# Patient Record
Sex: Female | Born: 1965 | Race: White | Hispanic: No | Marital: Married | Smoking: Never smoker
Health system: Southern US, Community
[De-identification: ages and names within clinical notes are randomized; demographics above are authoritative.]

## PROBLEM LIST (undated history)

## (undated) DIAGNOSIS — A048 Other specified bacterial intestinal infections: Secondary | ICD-10-CM

## (undated) DIAGNOSIS — K754 Autoimmune hepatitis: Secondary | ICD-10-CM

## (undated) HISTORY — PX: CYSTECTOMY: SUR359

## (undated) HISTORY — DX: Other disorders of bilirubin metabolism: E80.6

## (undated) HISTORY — DX: Autoimmune hepatitis: K75.4

## (undated) HISTORY — DX: Other specified bacterial intestinal infections: A04.8

---

## 2011-05-30 ENCOUNTER — Inpatient Hospital Stay (HOSPITAL_COMMUNITY)
Admission: RE | Admit: 2011-05-30 | Discharge: 2011-06-01 | DRG: 948 | Disposition: A | Discharge status: Home / Self Care | Payer: 59 | Source: Other Acute Inpatient Hospital | Attending: Internal Medicine | Admitting: Internal Medicine

## 2011-05-30 ENCOUNTER — Inpatient Hospital Stay (HOSPITAL_COMMUNITY): Payer: 59

## 2011-05-30 DIAGNOSIS — R7989 Other specified abnormal findings of blood chemistry: Principal | ICD-10-CM | POA: Diagnosis present

## 2011-05-30 DIAGNOSIS — R1013 Epigastric pain: Secondary | ICD-10-CM | POA: Diagnosis present

## 2011-05-30 DIAGNOSIS — R112 Nausea with vomiting, unspecified: Secondary | ICD-10-CM | POA: Diagnosis present

## 2011-05-31 ENCOUNTER — Encounter (HOSPITAL_COMMUNITY): Payer: Self-pay | Admitting: Radiology

## 2011-05-31 DIAGNOSIS — R1013 Epigastric pain: Secondary | ICD-10-CM

## 2011-05-31 DIAGNOSIS — R740 Nonspecific elevation of levels of transaminase and lactic acid dehydrogenase [LDH]: Secondary | ICD-10-CM

## 2011-05-31 DIAGNOSIS — R112 Nausea with vomiting, unspecified: Secondary | ICD-10-CM

## 2011-05-31 LAB — COMPREHENSIVE METABOLIC PANEL
ALT: 469 U/L — ABNORMAL HIGH (ref 0–35)
AST: 234 U/L — ABNORMAL HIGH (ref 0–37)
Albumin: 3.2 g/dL — ABNORMAL LOW (ref 3.5–5.2)
Alkaline Phosphatase: 119 U/L — ABNORMAL HIGH (ref 39–117)
Calcium: 8.9 mg/dL (ref 8.4–10.5)
Glucose, Bld: 103 mg/dL — ABNORMAL HIGH (ref 70–99)
Potassium: 3.5 mEq/L (ref 3.5–5.1)
Sodium: 137 mEq/L (ref 135–145)
Total Protein: 6.7 g/dL (ref 6.0–8.3)

## 2011-05-31 LAB — CBC
Hemoglobin: 13.4 g/dL (ref 12.0–15.0)
MCH: 31.6 pg (ref 26.0–34.0)
MCHC: 35.2 g/dL (ref 30.0–36.0)
Platelets: 264 10*3/uL (ref 150–400)
RDW: 12.9 % (ref 11.5–15.5)

## 2011-05-31 MED ORDER — IOHEXOL 300 MG/ML  SOLN
100.0000 mL | Freq: Once | INTRAMUSCULAR | Status: AC | PRN
  Administered 2011-05-30: 100 mL via INTRAVENOUS

## 2011-06-01 DIAGNOSIS — R7402 Elevation of levels of lactic acid dehydrogenase (LDH): Secondary | ICD-10-CM

## 2011-06-01 DIAGNOSIS — R1013 Epigastric pain: Secondary | ICD-10-CM

## 2011-06-01 DIAGNOSIS — R740 Nonspecific elevation of levels of transaminase and lactic acid dehydrogenase [LDH]: Secondary | ICD-10-CM

## 2011-06-01 LAB — C-REACTIVE PROTEIN: CRP: 0.4 mg/dL — ABNORMAL LOW (ref <0.6)

## 2011-06-01 LAB — HEPATIC FUNCTION PANEL
ALT: 444 U/L — ABNORMAL HIGH (ref 0–35)
Bilirubin, Direct: 1.1 mg/dL — ABNORMAL HIGH (ref 0.0–0.3)
Indirect Bilirubin: 1 mg/dL — ABNORMAL HIGH (ref 0.3–0.9)
Total Protein: 6.2 g/dL (ref 6.0–8.3)

## 2011-06-01 LAB — LIPASE, BLOOD: Lipase: 166 U/L — ABNORMAL HIGH (ref 11–59)

## 2011-06-03 ENCOUNTER — Telehealth: Payer: Self-pay | Admitting: Gastroenterology

## 2011-06-03 LAB — HEPATITIS PANEL, ACUTE
HCV Ab: NEGATIVE
Hep A IgM: NEGATIVE
Hepatitis B Surface Ag: NEGATIVE

## 2011-06-03 LAB — ANTI-SMOOTH MUSCLE ANTIBODY, IGG: F-Actin IgG: 15 U (ref <20)

## 2011-06-03 NOTE — Telephone Encounter (Signed)
Discussed with Mike Gip PA patient can return to work on 06/10/11 if she is feeling better.  She does need LFT's the end of this week.

## 2011-06-03 NOTE — Telephone Encounter (Signed)
Per discharge summary patient is to follow up with Mike Gip PA or Dr Marina Goodell in 10-14 days.  She is scheduled to see Dr Marina Goodell 06/19/11.  Patient wants to know if she is able to return to work on 06/10/11.  Amy Esterwood PA please advise.

## 2011-06-04 NOTE — Consult Note (Signed)
Taylor Howell                 ACCOUNT NO.:  [id number]  MEDICAL RECORD NO.:  [id number]  LOCATION:  5524                         FACILITY:  [facility]  PHYSICIAN:  Angelia Mould. Derrell Lolling, M.D.DATE OF BIRTH:  [birth year]  DATE OF CONSULTATION:  05/30/2011 DATE OF DISCHARGE:                                CONSULTATION   REQUESTING PHYSICIAN:  Peggye Pitt, MD  CONSULTING SURGEON:  Angelia Mould. Derrell Lolling, MD  REASON FOR CONSULTATION:  Hyperbilirubinemia.  HISTORY OF PRESENT ILLNESS:  Taylor Howell is a very pleasant 45 year old healthy white female who developed epigastric abdominal pain last Thursday.  She also developed some nausea and due to continued pain, she went to the Urgent Care Clinic in [place], [place].  At that time, she had a blood test drawn which was positive for H. pylori.  The patient was put on Prevpac and antibiotics for this condition.  Over the next several days, her pain improved some, however, this past Monday her pain worsened.  She developed significant nausea and vomiting as well as itching.  She went to [facility] where she was found to have an elevated total bilirubin as well as a mild elevation in her pancreatic enzymes with a lipase of 117.  She had further workup which included an ultrasound which was negative for gallstones, gallbladder wall thickening, or ductal dilatation but was admitted otherwise for "biliary pancreatitis".  Over the next several days, her transaminases began to slowly trend down, however, her bilirubin remained stable around 3.5.  At this time, it was felt by the physicians at [facility] that the patient needed an MRCP, however, they are unable to obtain this secondary to the patient's claustrophobia.  She was then requested to be transferred down here for further GI workup including a possible ERCP or surgical intervention.  Upon arrival, we have been asked to evaluate the patient.  She currently denies any abdominal  pain, nausea, or vomiting.  REVIEW OF SYSTEMS:  Please see HPI.  Otherwise all other systems have been reviewed and are negative.  FAMILY HISTORY:  Noncontributory.  PAST MEDICAL HISTORY:  There is none.  PAST SURGICAL HISTORY:  Removal of cyst from her head.  SOCIAL HISTORY:  The patient is married with 1 child.  She denies any alcohol, tobacco, or illicit drug abuse.  ALLERGIES:  NKDA.  MEDICATIONS:  Lansoprazole, amoxicillin, clarithromycin, and prochlorperazine.  PHYSICAL EXAMINATION:  GENERAL:  Taylor Howell is a very pleasant obese 45- year-old white female who is currently lying in bed in no acute distress. VITAL SIGNS:  Temperature 98.1, pulse 81, respirations 20, blood pressure 123/88. HEENT: Head is normocephalic, atraumatic.  Sclerae are slightly icteric. Otherwise pupils are equal, round, and reactive to light.  Ears and nose without any obvious masses or lesions.  No rhinorrhea.  Mouth is pink. Throat shows no exudate. HEART:  Regular rate and rhythm.  Normal S1, S2.  No murmurs, gallops, or rubs are noted.  She does have palpable carotid radial and pedal pulses bilaterally. LUNGS:  Clear to auscultation bilaterally with no wheezes, rhonchi, or rales noted.  Respiratory effort is nonlabored. ABDOMEN:  Soft, nontender, nondistended with active bowel sounds.  She  is otherwise obese but no masses, hernias, or organomegaly are noted. MUSCULOSKELETAL:  All 4 extremities are symmetrical.  No cyanosis, clubbing, or edema. NEURO:  Cranial nerves II through XII appear to be grossly intact.  Deep tendon reflex exam deferred at this time. PSYCH:  The patient is alert and oriented x3 with an appropriate affect.  LABS AND DIAGNOSTICS:  All pending at this time.  However, her most recent bilirubin was found to be 3.5 today at [facility].  She did have a ultrasound which revealed no gallstones, pericholecystic fluid, or gallbladder wall thickening or ductal  dilatation.  IMPRESSION: 1. Hyperbilirubinemia with transaminitis. 2. Mild pancreatitis of unknown etiology. 3. H. pylori.  PLAN:  The patient does not have any evidence of gallstones or any other findings on ultrasound that indicate a biliary source of her minimal pancreatitis.  There is also no obvious cause for her hyperbilirubinemia.  At this time we feel the patient needs further imaging and workup.  I have discussed this with Dr. Ardyth Harps of the hospitalist service and we agree with a CT scan and possible ERCP or EUS is needed following the CT scan.  At this time, there was no definite need for a laparoscopic cholecystectomy, However, we will follow along with you in case the need arises.  Thank you for this consultation.     Taylor Cape, PA   ______________________________ Angelia Mould. Derrell Lolling, M.D.    KEO/MEDQ  D:  05/31/2011  T:  05/31/2011  Job:  [id number]  cc:   Peggye Pitt, M.D.  Electronically Signed by Barnetta Chapel PA on 06/03/2011 04:19:36 PM Electronically Signed by Claud Kelp M.D. on 06/04/2011 09:03:23 AM

## 2011-06-05 ENCOUNTER — Encounter: Payer: Self-pay | Admitting: *Deleted

## 2011-06-05 ENCOUNTER — Telehealth: Payer: Self-pay | Admitting: Gastroenterology

## 2011-06-05 NOTE — Telephone Encounter (Signed)
Per pt request, faxed a return to work note for her signed by Dr Russella Dar- fax [phone]

## 2011-06-06 ENCOUNTER — Other Ambulatory Visit (INDEPENDENT_AMBULATORY_CARE_PROVIDER_SITE_OTHER): Payer: 59

## 2011-06-06 LAB — HEPATIC FUNCTION PANEL
ALT: 932 U/L — ABNORMAL HIGH (ref 0–35)
Albumin: 4.1 g/dL (ref 3.5–5.2)
Alkaline Phosphatase: 129 U/L — ABNORMAL HIGH (ref 39–117)
Bilirubin, Direct: 0.8 mg/dL — ABNORMAL HIGH (ref 0.0–0.3)
Total Protein: 8 g/dL (ref 6.0–8.3)

## 2011-06-10 ENCOUNTER — Telehealth: Payer: Self-pay

## 2011-06-10 NOTE — Telephone Encounter (Signed)
Message copied by Michele Mcalpine on Mon Jun 10, 2011 12:51 PM ------      Message from: Taylor Howell      Created: Mon Jun 10, 2011 12:06 PM       Liver tests are no better, actually worse. Please pull ALL lab results from her recent hospital stay and put them on my desk today. Get her to come in to see me tomorrow morning around 10 am

## 2011-06-10 NOTE — Telephone Encounter (Signed)
Left message for pt to call back asap.

## 2011-06-11 ENCOUNTER — Other Ambulatory Visit: Payer: Self-pay | Admitting: Internal Medicine

## 2011-06-11 ENCOUNTER — Other Ambulatory Visit (INDEPENDENT_AMBULATORY_CARE_PROVIDER_SITE_OTHER): Payer: 59

## 2011-06-11 ENCOUNTER — Other Ambulatory Visit: Payer: Self-pay

## 2011-06-11 ENCOUNTER — Encounter: Payer: Self-pay | Admitting: Internal Medicine

## 2011-06-11 ENCOUNTER — Telehealth: Payer: Self-pay

## 2011-06-11 ENCOUNTER — Ambulatory Visit (INDEPENDENT_AMBULATORY_CARE_PROVIDER_SITE_OTHER): Payer: 59 | Admitting: Internal Medicine

## 2011-06-11 VITALS — BP 112/68 | HR 80 | Ht 66.0 in | Wt 218.0 lb

## 2011-06-11 DIAGNOSIS — R748 Abnormal levels of other serum enzymes: Secondary | ICD-10-CM

## 2011-06-11 DIAGNOSIS — R933 Abnormal findings on diagnostic imaging of other parts of digestive tract: Secondary | ICD-10-CM

## 2011-06-11 LAB — CBC WITH DIFFERENTIAL/PLATELET
Basophils Relative: 0.3 % (ref 0.0–3.0)
Eosinophils Relative: 1.4 % (ref 0.0–5.0)
HCT: 41.2 % (ref 36.0–46.0)
Hemoglobin: 14.2 g/dL (ref 12.0–15.0)
Lymphs Abs: 1.3 10*3/uL (ref 0.7–4.0)
MCHC: 34.4 g/dL (ref 30.0–36.0)
MCV: 92.9 fl (ref 78.0–100.0)
Monocytes Absolute: 1 10*3/uL (ref 0.1–1.0)
Neutro Abs: 4.2 10*3/uL (ref 1.4–7.7)
RBC: 4.43 Mil/uL (ref 3.87–5.11)
WBC: 6.6 10*3/uL (ref 4.5–10.5)

## 2011-06-11 LAB — HEPATIC FUNCTION PANEL
Bilirubin, Direct: 3.5 mg/dL — ABNORMAL HIGH (ref 0.0–0.3)
Total Bilirubin: 5.7 mg/dL — ABNORMAL HIGH (ref 0.3–1.2)

## 2011-06-11 LAB — BASIC METABOLIC PANEL
BUN: 8 mg/dL (ref 6–23)
Creatinine, Ser: 0.6 mg/dL (ref 0.4–1.2)
GFR: 123.87 mL/min (ref >60.00)
Potassium: 3.8 mEq/L (ref 3.5–5.1)

## 2011-06-11 LAB — FERRITIN: Ferritin: 1190.8 ng/mL — ABNORMAL HIGH (ref 10.0–291.0)

## 2011-06-11 NOTE — Telephone Encounter (Signed)
Spoke with pt and she is aware. Orders entered for biopsy. Waiting to hear back from IR for appt.

## 2011-06-11 NOTE — Telephone Encounter (Signed)
Message copied by Michele Mcalpine on Tue Jun 11, 2011  4:20 PM ------      Message from: Hilarie Fredrickson      Created: Tue Jun 11, 2011  4:11 PM       Please let the patient know that her liver tests are no better, actually worse. Fortunately, her liver performance study such as albumin and prothrombin time remained normal (this is good). We discussed the possible value of liver biopsy. I would like to proceed with an ultrasound guided CORE BIOPSY of the liver this week (performed by interventional radiology). The reason "significantly elevated hepatic transaminases of uncertain cause".

## 2011-06-11 NOTE — Patient Instructions (Signed)
Go to basement floor and have your labs done today. Keep appointment with Dr. Marina Goodell for 06/19/11.

## 2011-06-11 NOTE — Telephone Encounter (Signed)
Pt aware of results per Dr. Marina Goodell, she will be here for her 10am appt.

## 2011-06-11 NOTE — Progress Notes (Signed)
HISTORY OF PRESENT ILLNESS:  Taylor Howell is a 45 y.o. female with no significant past medical history who presents today, post hospitalization, regarding problems with abdominal pain and abnormal liver tests. She was seen in consultation on 05/30/2011 when she was transferred to this hospital from an outside facility with the presumptive diagnosis of biliary pancreatitis. Her chief complaints were that of malaise and abdominal pain followed by problems with nausea, vomiting, and pruritus after being treated with triple therapy for the positive H. pylori antibody. She did have a minimally elevated lipase. Liver tests were significantly abnormal with SGOT 200 range SGPT in the 400 range. Alkaline phosphatase 119 total bilirubin 3.0. Total protein 6.7 and albumin 3.2. Imaging studies were fairly unremarkable with normal liver, delivery system, and pancreas. Some nonspecific nodes in the porta hepatis and hepatic duodenal region were noted. Clinically, she did well with no evidence of fever, nausea, or vomiting. Her abdominal pain resolved and she was able to eat without difficulty. Other laboratories were normal including CBC, prothrombin time, basic chemistries, acute hepatitis serologies, antinuclear antibody, anti-smooth muscle antibody, antimitochondrial antibody, ceruloplasmin, and C-reactive protein. She was discharged home on July 14. Complaints since that time have included nonspecific fatigue, significant reflux with pyrosis, and mild nausea. She is taking no medications regularly. She does have occasional intermittent tenderness on her right side. Good bowel habits. Routine repeat liver studies were ordered and performed on July 19. This revealed worsening transaminases with SGPT 932, SGOT 468, alkaline phosphatase 129, and total bilirubin 1.8. Normal protein of 8.0 with normal albumin 4.1. She was instructed to come into the office at this time, premature of her scheduled office appointment next week.  She is accompanied by her son  REVIEW OF SYSTEMS:  All non-GI ROS negative except for fatigue and itching.  Past Medical History  Diagnosis Date  . Helicobacter pylori (H. pylori)   . Hyperbilirubinemia     Past Surgical History  Procedure Date  . Cystectomy     Head    Social History Taylor Howell  reports that she has never smoked. She has never used smokeless tobacco. She reports that she does not drink alcohol or use illicit drugs.  family history includes Diabetes in her maternal grandfather.  There is no history of Colon cancer.  No Known Allergies     PHYSICAL EXAMINATION: Vital signs: BP 112/68  Pulse 80  Ht 5\' 6"  (1.676 m)  Wt 218 lb (98.884 kg)  BMI 35.19 kg/m2  LMP 06/03/2011  Constitutional: generally well-appearing, no acute distress Psychiatric: alert and oriented x3, cooperative. Anxious Eyes: extraocular movements intact, anicteric, conjunctiva pink Mouth: oral pharynx moist, no lesions Neck: supple no lymphadenopathy Cardiovascular: heart regular rate and rhythm, no murmur Lungs: clear to auscultation bilaterally Abdomen: soft, nontender, nondistended, no obvious ascites, no peritoneal signs, normal bowel sounds, no organomegaly Extremities: no lower extremity edema bilaterally Skin: no lesions on visible extremities Neuro: No focal deficits. No asterixis.   ASSESSMENT:  #1. Marked hepatic transaminitis after problems with acute illness as described. Negative workup to date as outlined. Possible causes include atypical infectious hepatitis, drug reaction (amoxicillin and Biaxin exposure), or ANA negative autoimmune hepatitis. Clinically stable without evidence of hepatic synthetic dysfunction. #2. Portal adenopathy on CT of uncertain clinical significance.   PLAN:  #1. Check anti-liver-kidney-microsomal antibody, iron studies, and repeat transaminases, CBC, TSH, and prothrombin time. #2. Keep routine followup appointment next week. Contact the  office in the interim for questions or problems #3.  Will likely need liver biopsy if liver test abnormalities persist without cause identified. I discussed this with her and her son in detail.

## 2011-06-12 ENCOUNTER — Telehealth: Payer: Self-pay | Admitting: Internal Medicine

## 2011-06-12 NOTE — Telephone Encounter (Signed)
She has been having discomfort intermittently. Unless this becomes severe, I would proceed with the plans for liver biopsy tomorrow.

## 2011-06-12 NOTE — Telephone Encounter (Signed)
Pt states that yesterday evening she started having some discomfort on both sides below her shoulder blades on her back. Pt reports that she is still having it, has had it all day and it is a constant discomfort, rates the pain at a 5. Pt wants to know if this is something she should be concerned about. Please advise.

## 2011-06-12 NOTE — Telephone Encounter (Signed)
U/S guided core biopsy of liver scheduled for 06/13/11@[facility]  arrival time 8:30am, appt time 10am. Taylor Howell in radiology notified pt of appt date and time.

## 2011-06-12 NOTE — Telephone Encounter (Signed)
Pt aware.

## 2011-06-13 ENCOUNTER — Other Ambulatory Visit: Payer: Self-pay | Admitting: Interventional Radiology

## 2011-06-13 ENCOUNTER — Ambulatory Visit (HOSPITAL_COMMUNITY)
Admission: RE | Admit: 2011-06-13 | Discharge: 2011-06-13 | Disposition: A | Discharge status: Home / Self Care | Payer: 59 | Source: Ambulatory Visit | Attending: Internal Medicine | Admitting: Internal Medicine

## 2011-06-13 DIAGNOSIS — R7401 Elevation of levels of liver transaminase levels: Secondary | ICD-10-CM | POA: Insufficient documentation

## 2011-06-13 DIAGNOSIS — Z01812 Encounter for preprocedural laboratory examination: Secondary | ICD-10-CM | POA: Insufficient documentation

## 2011-06-13 DIAGNOSIS — R7402 Elevation of levels of lactic acid dehydrogenase (LDH): Secondary | ICD-10-CM | POA: Insufficient documentation

## 2011-06-13 LAB — CBC
HCT: 38.8 % (ref 36.0–46.0)
Hemoglobin: 13.7 g/dL (ref 12.0–15.0)
MCH: 31.7 pg (ref 26.0–34.0)
MCV: 89.8 fL (ref 78.0–100.0)
RBC: 4.32 MIL/uL (ref 3.87–5.11)

## 2011-06-13 LAB — PROTIME-INR: Prothrombin Time: 13.2 seconds (ref 11.6–15.2)

## 2011-06-14 ENCOUNTER — Telehealth: Payer: Self-pay | Admitting: Internal Medicine

## 2011-06-14 ENCOUNTER — Telehealth: Payer: Self-pay

## 2011-06-14 LAB — ANTI-MICROSOMAL ANTIBODY LIVER / KIDNEY: LKM1 Ab: <=20 U

## 2011-06-14 MED ORDER — PREDNISONE 20 MG PO TABS: ORAL_TABLET | ORAL | Status: DC

## 2011-06-14 NOTE — Telephone Encounter (Signed)
rx sent to pharmacy

## 2011-06-14 NOTE — Telephone Encounter (Signed)
Taylor Howell, I spoke with the pathologist, Dr. Frederica Kuster, who feels changes a most c/w autoimmune hepatitis or drug. With rising LFTs , I want her to start prednisone 40 mg daily and keep her OV next week.

## 2011-06-14 NOTE — Telephone Encounter (Signed)
Called pts home phone and left message for her to call back. Called pts work number and pt was not there. Called cell number and left message for her to call back. Prescription for Prednisone sent to the pharmacy.  Spoke with pt and she is aware of the results per Dr. Marina Goodell. Rx sent to the pharmacy, pt instructed on Prednisone side effects. Pt knows to keep her OV next week. Pt verbalized understanding.

## 2011-06-19 ENCOUNTER — Encounter: Payer: Self-pay | Admitting: Internal Medicine

## 2011-06-19 ENCOUNTER — Other Ambulatory Visit (INDEPENDENT_AMBULATORY_CARE_PROVIDER_SITE_OTHER): Payer: 59

## 2011-06-19 ENCOUNTER — Ambulatory Visit (INDEPENDENT_AMBULATORY_CARE_PROVIDER_SITE_OTHER): Payer: 59 | Admitting: Internal Medicine

## 2011-06-19 VITALS — BP 118/74 | HR 80 | Ht 66.0 in | Wt 219.0 lb

## 2011-06-19 DIAGNOSIS — K219 Gastro-esophageal reflux disease without esophagitis: Secondary | ICD-10-CM

## 2011-06-19 DIAGNOSIS — K754 Autoimmune hepatitis: Secondary | ICD-10-CM

## 2011-06-19 LAB — PROTIME-INR: INR: 1 ratio (ref 0.8–1.0)

## 2011-06-19 NOTE — Progress Notes (Signed)
HISTORY OF PRESENT ILLNESS:  Taylor Howell is a 45 y.o. female who has been evaluated recently for abnormal liver tests and GERD. At the time of her last visit, her hepatic transaminases and bilirubin worsened with SGOT greater than 400 and SGPT greater than 1100. Total bilirubin 5.7. Prothrombin time normal. All viral and non-viral studies, including autoimmune studies were negative or normal. We elected to proceed with liver biopsy. I discussed this result with the pathologist, Dr. Frederica Kuster who reported changes most consistent with autoimmune hepatitis versus drug. She was initiated on prednisone 40 mg daily, which she began 4 days ago. She is accompanied today by her husband, who tape records the encounter. The patient states that she feels a little better. Still with some right scapular pain. Improved appetite without vomiting. Reflux symptoms have improved on PPI. Tolerating prednisone with minimal side effects.  REVIEW OF SYSTEMS:  All non-GI ROS negative except for pruritus  Past Medical History  Diagnosis Date  . Helicobacter pylori (H. pylori)   . Hyperbilirubinemia   . Autoimmune hepatitis     Past Surgical History  Procedure Date  . Cystectomy     Head    Social History Taylor Howell  reports that she has never smoked. She has never used smokeless tobacco. She reports that she does not drink alcohol or use illicit drugs.  family history includes Diabetes in her maternal grandfather.  There is no history of Colon cancer.  No Known Allergies     PHYSICAL EXAMINATION: Vital signs: BP 118/74  Pulse 80  Ht 5\' 6"  (1.676 m)  Wt 219 lb (99.338 kg)  BMI 35.35 kg/m2  LMP 06/03/2011 General: Well-developed, well-nourished, no acute distress HEENT: Sclerae are anicteric, conjunctiva pink. Oral mucosa intact Lungs: Clear Heart: Regular Abdomen: soft, obese, nontender, nondistended, no obvious ascites, no peritoneal signs, normal bowel sounds. No organomegaly. Extremities: No  edema Psychiatric: alert and oriented x3. Cooperative    ASSESSMENT:  #1. 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 Now on prednisone. #2. GERD. Improved on PPI   PLAN:  #1. Detailed discussion today on autoimmune hepatitis. Multiple questions answered to their satisfaction. #2. Continue prednisone 40 mg daily #3. Repeat LFTs and PT/INR today and once weekly thereafter until further notice #4. Office followup in a few weeks #5. Reflux precautions and PPI to be continued

## 2011-06-19 NOTE — Patient Instructions (Signed)
Go to basement floor today and have labs drawn. You will have them weekly  Follow-up with Dr. Marina Goodell in 3 weeks.

## 2011-06-20 LAB — HEPATIC FUNCTION PANEL
ALT: 552 U/L — ABNORMAL HIGH (ref 0–35)
AST: 275 U/L — ABNORMAL HIGH (ref 0–37)
Alkaline Phosphatase: 148 U/L — ABNORMAL HIGH (ref 39–117)
Bilirubin, Direct: 2.4 mg/dL — ABNORMAL HIGH (ref 0.0–0.3)
Total Bilirubin: 5 mg/dL — ABNORMAL HIGH (ref 0.3–1.2)

## 2011-06-21 ENCOUNTER — Telehealth: Payer: Self-pay

## 2011-06-21 NOTE — Telephone Encounter (Signed)
Pt aware.

## 2011-06-21 NOTE — Telephone Encounter (Signed)
Message copied by Michele Mcalpine on Fri Jun 21, 2011  9:26 AM ------      Message from: Hilarie Fredrickson      Created: Thu Jun 20, 2011  2:20 PM       Let pt know that her LFTs are improving. Repeat LFTs in one week

## 2011-06-27 ENCOUNTER — Other Ambulatory Visit (INDEPENDENT_AMBULATORY_CARE_PROVIDER_SITE_OTHER): Payer: 59

## 2011-06-27 DIAGNOSIS — K754 Autoimmune hepatitis: Secondary | ICD-10-CM

## 2011-06-27 LAB — HEPATIC FUNCTION PANEL
ALT: 255 U/L — ABNORMAL HIGH (ref 0–35)
Alkaline Phosphatase: 103 U/L (ref 39–117)
Bilirubin, Direct: 0.9 mg/dL — ABNORMAL HIGH (ref 0.0–0.3)
Total Bilirubin: 2.4 mg/dL — ABNORMAL HIGH (ref 0.3–1.2)

## 2011-06-29 NOTE — Discharge Summary (Signed)
Taylor Howell, Taylor Howell                 ACCOUNT NO.:  [id number]  MEDICAL RECORD NO.:  [id number]  LOCATION:  5524                         FACILITY:  [facility]  PHYSICIAN:  Peggye Pitt, M.D. DATE OF BIRTH:  1966-09-03  DATE OF ADMISSION:  05/30/2011 DATE OF DISCHARGE:  06/01/2011                              DISCHARGE SUMMARY   PRIMARY CARE PHYSICIAN:  Dr. Willaim Bane in [place].  DISCHARGE DIAGNOSES: 1. Elevated liver function tests, still of unknown etiology. 2. Nausea, vomiting, abdominal pain, resolved, question secondary to     #1 versus positive H. pylori serology. 3. Positive H. pylori serology.  DISCHARGE MEDICATIONS: 1. Protonix 40 mg daily. 2. Phenergan 10 mg every 6 hours as needed for nausea.  DISPOSITION AND FOLLOWUP:  [name] will be discharged home today in stable condition.  She will need to follow up with the [facility] with either Taylor Howell or with Dr. Marina Goodell in about 10-14 days.  At that time, we should recheck her LFTs and see if any of the results of her hepatitis workup have resulted.  CONSULTATION THIS HOSPITALIZATION: 1. Angelia Mould. Derrell Lolling, MD, with Surgery. 2. Wilhemina Bonito. Marina Goodell, MD, with GI.  IMAGES AND PROCEDURES:  Include a CT scan of the abdomen and pelvis with and without contrast on May 31, 2011, that showed no CT evidence of pancreatitis, intra or extrahepatic biliary ductal dilatation.  Normal pancreatic duct.  No pancreatic mass.  Porta hepatis and hepatic duodenal lymphadenopathy without cause identified.  This could be associated with a lymphoproliferative disorder or be reactive to an inflammatory process such as hepatitis.  Neoplastic adenopathy cannot be ruled out.  There is a small peripheral calcified nodule in the right perirectal fat that is probably benign, but nonspecific.  HISTORY AND PHYSICAL:  For full dictations, please refer to dictation by myself on May 30, 2011, but in brief Ms. Taylor Howell is a pleasant,  somewhat obese 45 year old Caucasian lady with no significant past medical history other than positive H. pylori serology that have been diagnosed prior to her admission, who presented to [facility] on May 28, 2011, with complaints of nausea, vomiting, abdominal pain, found to have transaminitis, was later referred to [place] for further workup.  HOSPITAL COURSE BY PROBLEM:  Elevated liver function tests.  This is mainly transaminases.  CT scan and ultrasound (done in [facility]) showed no evidence for gallstones, choledocholithiasis, or biliary dilatation.  At this point, the cause of her transaminitis remains a mystery.  So far, viral hepatitis entities have been ruled out, although all the serology is not back.  We do know that her hepatitis B surface antigen is negative and that hepatitis C antibody is negative.  We also need to consider rare hepatitis diagnosis such as Wilson disease versus autoimmune hepatitis.  Ceruloplasmin, ANA, and smooth muscle antibody are pending at this time.  I have discussed the patient in detail today with Dr. Claudette Head with [facility].  Given the patient's lack of symptoms and otherwise reassuring clinical appearance, we will proceed to discharge her home today to follow up in the [facility] in approximately 10-14 days.  At that time, her LFTs  will need to be followed and will need to see if any of her rest of serologic labs have returned.  If her transaminitis continues without reason, then we may need to consider liver biopsy to further work up. It is also possible that the antibiotics that she was taking for her H. Pylori may have caused toxic hepatitis.  I have decided not to treat her H. pylori at this time, although we will give her a proton pump inhibitor as she states she still has significant reflux, especially after eating.  Vitals on day of discharge:  Blood pressure 130/86, heart rate 84, respirations 20, sats of 99%  on room air, and a temperature of 98.2.     Peggye Pitt, M.D.     EH/MEDQ  D:  06/01/2011  T:  06/02/2011  Job:  [id number]  cc:   Angelia Mould. Derrell Lolling, M.D. Wilhemina Bonito. Marina Goodell, MD  Electronically Signed by Peggye Pitt M.D. on 06/29/2011 02:01:53 PM

## 2011-06-29 NOTE — H&P (Signed)
Taylor Howell, Taylor Howell                 ACCOUNT NO.:  [id number]  MEDICAL RECORD NO.:  [id number]  LOCATION:  5524                         FACILITY:  [facility]  PHYSICIAN:  Peggye Pitt, M.D. DATE OF BIRTH:  December 12, 1965  DATE OF ADMISSION:  05/30/2011 DATE OF DISCHARGE:                             HISTORY & PHYSICAL   PRIMARY CARE PHYSICIAN:  Dr. Willaim Bane in [place].  CHIEF COMPLAINT:  "Biliary pancreatitis."  HISTORY OF PRESENT ILLNESS:  Taylor Howell is a pleasant 45 year old Caucasian lady with no significant past medical history who presents to [place] today as a transfer from [facility].  Apparently, she presented there on May 28, 2011, with complaints of nausea, vomiting, and abdominal pain.  On admission, she was found to have elevated LFTs in an obstructive pattern.  Per report, an ultrasound was done that showed no gallbladder stones or biliary ductal dilatation and the physician at [facility] wanted to do an MRCP, which the patient was unable to do secondary to her claustrophobia.  Because of inability to do MRCP, she is transferred to [place] today after the physician at [facility] spoke with Dr. Derrell Lolling with surgery who said he would be happy to consult if the hospitalist team would admitted.  On evaluation today, Taylor Howell denies any abdominal pain, any nausea, or vomiting.  She also had pancreatitis, her lipase on admission was 72.  No further imaging tests have been obtained.  It is noted that her LFTs, although improving are still elevated.  ALLERGIES:  She has no known drug allergies.  PAST MEDICAL HISTORY:  Noncontributory.  HOME MEDICATIONS:  None.  SOCIAL HISTORY:  She denies any alcohol, tobacco, or illicit drug use. She is married and has one child, aged 41.  Her child and her husband are present at the time of my exam.  FAMILY HISTORY:  Significant for a father who had a myocardial infarction in his late 77s, he was also an alcoholic.  Her mother and a maternal aunt  have also had gallbladder disease requiring cholecystectomies.  REVIEW OF SYSTEMS:  Negative.  Except as mentioned in history present illness.  PHYSICAL EXAMINATION:  VITAL SIGNS:  On admission blood pressure 123/88, heart rate 81, respirations 20, sats of 100% on room air, and a temp of 98.1. GENERAL:  She is alert, awake, and oriented x3 in no acute distress. HEENT:  Normocephalic, atraumatic.  Her pupils are equal, round, and reactive to light and accommodation.  She has intact extraocular movement.  She does have some scleral icterus. NECK:  Supple.  No JVD, no lymphadenopathy, no bruits, no goiter. HEART:  Regular rate and rhythm without murmurs, rubs, or gallops. LUNGS:  Clear to auscultation bilaterally. ABDOMEN:  Mildly obese, soft, nontender, nondistended with positive bowel sounds. EXTREMITIES:  She has no clubbing, cyanosis, or edema with positive pedal pulses. NEUROLOGIC:  Intact and nonfocal.  LABORATORY DATA:  Blood work from [facility] today:  Only hepatic profile was checked today with a total protein of 6.4, albumin of 3.3, alk phos of 104, AST of 207, ALT of 429, a total bili of 3.4, direct bili of 1.5, amylase of 91, and a lipase of 88.  Her  LFTs, although elevated are still decreased from admission at which time her total bili was 3.6, alk phos of 149, AST of 256, and an ALT of 583.  ASSESSMENT AND PLAN: 1. Mild pancreatitis by lab work, with transaminitis.  An ultrasound     done at [facility] does not show any evidence for     gallbladder stones or choledocholithiasis.  I believe we need to     have further imaging at this time.  I will order a CT scan to     further evaluate her liver, her gallbladder, and her common bile     duct.  Surgery has already seen the patient and GI has as well been     consulted to consider ERCP.  It is not clear to me at this point     that she truly has biliary pancreatitis.  Other considerations such     as a  sphincter of Oddi dysfunction versus pancreatic mass need to     be considered at this time, and I believe that a CT scan would be     helpful in evaluating.  We will recheck LFTs in the morning, I will     also go ahead and check an acute hepatitis panel, which has not     been done at [facility]. 2. Her positive H. Pylori antibody.  She has not been treated.     Consider discharging her on treatment for her H. pylori.  We will     not start at this time given her possible invasive procedures in     the morning. 3. For prophylaxis.  I will place her on Lovenox.     Peggye Pitt, M.D.     EH/MEDQ  D:  05/30/2011  T:  05/30/2011  Job:  [id number]  Electronically Signed by Peggye Pitt M.D. on 06/29/2011 02:02:04 PM

## 2011-07-01 ENCOUNTER — Telehealth: Payer: Self-pay | Admitting: Internal Medicine

## 2011-07-02 ENCOUNTER — Telehealth: Payer: Self-pay | Admitting: Internal Medicine

## 2011-07-02 NOTE — Telephone Encounter (Signed)
This was sent to me for lab results.  I am forwarding it to you.

## 2011-07-02 NOTE — Telephone Encounter (Signed)
Pts temporary crown cracked last night. She went to the dentist and they were able to place the new crown without any numbing medication.

## 2011-07-02 NOTE — Telephone Encounter (Signed)
Left message for pt to call back  °

## 2011-07-02 NOTE — Telephone Encounter (Signed)
Called number and no answer, will try again later.

## 2011-07-02 NOTE — Telephone Encounter (Signed)
Spoke with pt regarding her lab results. 

## 2011-07-03 ENCOUNTER — Telehealth: Payer: Self-pay

## 2011-07-03 NOTE — Telephone Encounter (Signed)
Message copied by Michele Mcalpine on Wed Jul 03, 2011  8:43 AM ------      Message from: Hilarie Fredrickson      Created: Tue Jul 02, 2011  5:03 PM       Let patient know that labs are much better. Please continue on prednisone and repeat LFTs next week

## 2011-07-03 NOTE — Telephone Encounter (Signed)
Pt aware.

## 2011-07-05 ENCOUNTER — Telehealth: Payer: Self-pay

## 2011-07-05 ENCOUNTER — Other Ambulatory Visit (INDEPENDENT_AMBULATORY_CARE_PROVIDER_SITE_OTHER): Payer: 59

## 2011-07-05 DIAGNOSIS — K754 Autoimmune hepatitis: Secondary | ICD-10-CM

## 2011-07-05 LAB — HEPATIC FUNCTION PANEL
AST: 127 U/L — ABNORMAL HIGH (ref 0–37)
Albumin: 3.7 g/dL (ref 3.5–5.2)
Alkaline Phosphatase: 82 U/L (ref 39–117)

## 2011-07-05 LAB — PROTIME-INR: INR: 1 ratio (ref 0.8–1.0)

## 2011-07-05 NOTE — Telephone Encounter (Signed)
Message copied by Michele Mcalpine on Fri Jul 05, 2011  2:54 PM ------      Message from: Hilarie Fredrickson      Created: Caleen Essex Jul 05, 2011  2:29 PM       Please the patient know that her blood work continues to improve. Continue on prednisone without change. We will recheck her blood work at the time of her followup appointment in 2 weeks

## 2011-07-05 NOTE — Telephone Encounter (Signed)
Pt aware.

## 2011-07-10 ENCOUNTER — Other Ambulatory Visit: Payer: Self-pay | Admitting: Internal Medicine

## 2011-07-16 ENCOUNTER — Telehealth: Payer: Self-pay | Admitting: *Deleted

## 2011-07-16 NOTE — Telephone Encounter (Signed)
Called patient and left message on vmail to have labs drawn prior to her visit on the 28th with Dr. Marina Goodell.

## 2011-07-17 ENCOUNTER — Ambulatory Visit (INDEPENDENT_AMBULATORY_CARE_PROVIDER_SITE_OTHER): Payer: 59 | Admitting: Internal Medicine

## 2011-07-17 ENCOUNTER — Other Ambulatory Visit (INDEPENDENT_AMBULATORY_CARE_PROVIDER_SITE_OTHER): Payer: 59

## 2011-07-17 ENCOUNTER — Telehealth: Payer: Self-pay

## 2011-07-17 ENCOUNTER — Encounter: Payer: Self-pay | Admitting: Internal Medicine

## 2011-07-17 VITALS — BP 104/64 | HR 84 | Ht 66.0 in | Wt 219.0 lb

## 2011-07-17 DIAGNOSIS — K754 Autoimmune hepatitis: Secondary | ICD-10-CM

## 2011-07-17 DIAGNOSIS — K219 Gastro-esophageal reflux disease without esophagitis: Secondary | ICD-10-CM

## 2011-07-17 LAB — HEPATIC FUNCTION PANEL
ALT: 188 U/L — ABNORMAL HIGH (ref 0–35)
Alkaline Phosphatase: 74 U/L (ref 39–117)
Bilirubin, Direct: 0.4 mg/dL — ABNORMAL HIGH (ref 0.0–0.3)
Total Protein: 7 g/dL (ref 6.0–8.3)

## 2011-07-17 NOTE — Patient Instructions (Signed)
Follow-up in 2 months  We will call you with your lab results and let you know when you need to return for follow-up labs.

## 2011-07-17 NOTE — Telephone Encounter (Signed)
Message copied by Michele Mcalpine on Wed Jul 17, 2011  1:58 PM ------      Message from: Hilarie Fredrickson      Created: Wed Jul 17, 2011  1:43 PM       Please tell the patient that her liver tests continue to improve. Repeat LFTs in 3 weeks.

## 2011-07-17 NOTE — Telephone Encounter (Signed)
Pt aware.

## 2011-07-17 NOTE — Progress Notes (Signed)
HISTORY OF PRESENT ILLNESS:  Taylor Howell is a 45 y.o. 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 She was started on prednisone about one month ago, with ongoing significant improvement in liver tests. She was last seen on August 1. Shows has a history of GERD for which she takes pantoprazole. Since her last visit she has done well. She is accompanied today by her mother. Patient's only complaint is intermittent right-sided discomfort which is associated with certain movements and increased activity. Overall, she is tolerating prednisone well. She has minimal sleep disturbance, and the ankle edema, and no effect on appetite or mood. Her urine color is normal. No other complaints. No GERD symptoms.  REVIEW OF SYSTEMS:  All non-GI ROS negative except for anxiety.  Past Medical History  Diagnosis Date  . Helicobacter pylori (H. pylori)   . Hyperbilirubinemia   . Autoimmune hepatitis     Past Surgical History  Procedure Date  . Cystectomy     Head    Social History [name]  reports that she has never smoked. She has never used smokeless tobacco. She reports that she does not drink alcohol or use illicit drugs.  family history includes Diabetes in her maternal grandfather.  There is no history of Colon cancer.  No Known Allergies     PHYSICAL EXAMINATION: Vital signs: BP 104/64  Pulse 84  Ht 5\' 6"  (1.676 m)  Wt 219 lb (99.338 kg)  BMI 35.35 kg/m2  LMP 07/03/2011 General: Well-developed, well-nourished, no acute distress HEENT: Sclerae are anicteric, conjunctiva pink. Oral mucosa intact Lungs: Clear Heart: Regular Abdomen: soft, obese, nontender, nondistended, no obvious ascites, no peritoneal signs, normal bowel sounds. No organomegaly. Some tenderness along the right rib cage Extremities: No edema Psychiatric: alert and oriented x3. Cooperative    ASSESSMENT:  #1. Acute hepatitis, autoimmune versus  drug induced autoimmune picture. Ongoing improvement on prednisone. #2. Intermittent right-sided discomfort. Musculoskeletal. #3. GERD. Improved on PPI   PLAN:  #1. Continue prednisone 40 mg daily. #2. Liver function tests and prothrombin time today #3. Continue PPI for GERD #4. Routine office followup in about 2 months. Contact the office in the interim for questions or problems.

## 2011-08-06 ENCOUNTER — Telehealth: Payer: Self-pay

## 2011-08-06 ENCOUNTER — Other Ambulatory Visit (INDEPENDENT_AMBULATORY_CARE_PROVIDER_SITE_OTHER): Payer: 59

## 2011-08-06 DIAGNOSIS — R7989 Other specified abnormal findings of blood chemistry: Secondary | ICD-10-CM

## 2011-08-06 LAB — HEPATIC FUNCTION PANEL
Albumin: 3.8 g/dL (ref 3.5–5.2)
Bilirubin, Direct: 0.1 mg/dL (ref 0.0–0.3)
Total Protein: 7.1 g/dL (ref 6.0–8.3)

## 2011-08-06 NOTE — Telephone Encounter (Signed)
Message copied by Michele Mcalpine on Tue Aug 06, 2011  4:38 PM ------      Message from: Hilarie Fredrickson      Created: Tue Aug 06, 2011  4:31 PM       Let the patient know that her liver tests continued to improve and are close to normal. I want her to decrease her prednisone to 30 mg daily and repeat LFTs in 2 weeks

## 2011-08-06 NOTE — Telephone Encounter (Signed)
Pt aware. She may need refill on med soon, instructed pt to call pharmacy for refill.

## 2011-08-07 ENCOUNTER — Other Ambulatory Visit: Payer: Self-pay | Admitting: Internal Medicine

## 2011-08-20 ENCOUNTER — Other Ambulatory Visit (INDEPENDENT_AMBULATORY_CARE_PROVIDER_SITE_OTHER): Payer: 59

## 2011-08-20 ENCOUNTER — Telehealth: Payer: Self-pay

## 2011-08-20 ENCOUNTER — Other Ambulatory Visit: Payer: Self-pay | Admitting: *Deleted

## 2011-08-20 DIAGNOSIS — R748 Abnormal levels of other serum enzymes: Secondary | ICD-10-CM

## 2011-08-20 LAB — HEPATIC FUNCTION PANEL
Alkaline Phosphatase: 67 U/L (ref 39–117)
Bilirubin, Direct: 0.1 mg/dL (ref 0.0–0.3)

## 2011-08-20 NOTE — Telephone Encounter (Signed)
Pt aware.

## 2011-08-20 NOTE — Telephone Encounter (Signed)
Message copied by Michele Mcalpine on Tue Aug 20, 2011  1:25 PM ------      Message from: Hilarie Fredrickson      Created: Tue Aug 20, 2011 11:44 AM       Let pt know that lfts continue to improve. Decrease prednisone to 20mg  and repeat LFTs in 2 weeks

## 2011-09-03 ENCOUNTER — Other Ambulatory Visit (INDEPENDENT_AMBULATORY_CARE_PROVIDER_SITE_OTHER): Payer: 59

## 2011-09-03 ENCOUNTER — Telehealth: Payer: Self-pay

## 2011-09-03 DIAGNOSIS — K754 Autoimmune hepatitis: Secondary | ICD-10-CM

## 2011-09-03 DIAGNOSIS — R7989 Other specified abnormal findings of blood chemistry: Secondary | ICD-10-CM

## 2011-09-03 LAB — HEPATIC FUNCTION PANEL
ALT: 47 U/L — ABNORMAL HIGH (ref 0–35)
AST: 31 U/L (ref 0–37)
Albumin: 3.8 g/dL (ref 3.5–5.2)
Alkaline Phosphatase: 68 U/L (ref 39–117)
Total Protein: 6.9 g/dL (ref 6.0–8.3)

## 2011-09-03 NOTE — Telephone Encounter (Signed)
Pt aware.

## 2011-09-03 NOTE — Telephone Encounter (Signed)
Message copied by Michele Mcalpine on Tue Sep 03, 2011  4:08 PM ------      Message from: Hilarie Fredrickson      Created: Tue Sep 03, 2011  4:04 PM       Let pt know that labs continue to improve (almost normal). Stay on 20 mg prednisone and repeat lfts in 4 weeks

## 2011-09-11 ENCOUNTER — Encounter: Payer: Self-pay | Admitting: Internal Medicine

## 2011-09-11 ENCOUNTER — Ambulatory Visit (INDEPENDENT_AMBULATORY_CARE_PROVIDER_SITE_OTHER): Payer: 59 | Admitting: Internal Medicine

## 2011-09-11 ENCOUNTER — Telehealth: Payer: Self-pay | Admitting: Internal Medicine

## 2011-09-11 VITALS — BP 116/68 | HR 68 | Ht 66.0 in | Wt 228.6 lb

## 2011-09-11 DIAGNOSIS — K754 Autoimmune hepatitis: Secondary | ICD-10-CM

## 2011-09-11 DIAGNOSIS — K219 Gastro-esophageal reflux disease without esophagitis: Secondary | ICD-10-CM

## 2011-09-11 NOTE — Patient Instructions (Signed)
Dr. Marina Goodell would like to see you back in the office for a follow up in 3 months.

## 2011-09-11 NOTE — Progress Notes (Signed)
HISTORY OF PRESENT ILLNESS:  Taylor Howell is a 45 y.o. 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 She was started on prednisone about 2 1/2  months ago, with ongoing significant improvement in liver tests. She was last seen on August 29. She has a history of GERD for which she takes pantoprazole. Since her last visit she has done well. She is accompanied today by her mother. Patient's only complaint is intermittent jitteriness which has improved with lowering dose of prednisone. Currently on 20 mg of prednisone. Last liver function studies close to normal with AST 31, ALT 47, alkaline phosphatase 68, total bilirubin 0.8, total protein 6.9, and albumin 3.8. She's had some weight gain on prednisone. No other complaints.   REVIEW OF SYSTEMS:  All non-GI ROS negative except for palpitations  Past Medical History  Diagnosis Date  . Helicobacter pylori (H. pylori)   . Hyperbilirubinemia   . Autoimmune hepatitis     Past Surgical History  Procedure Date  . Cystectomy     Head    Social History Taylor Howell  reports that she has never smoked. She has never used smokeless tobacco. She reports that she does not drink alcohol or use illicit drugs.  family history includes Breast cancer in her maternal aunt and Diabetes in her maternal grandfather.  There is no history of Colon cancer.  No Known Allergies     PHYSICAL EXAMINATION:  Vital signs: BP 116/68  Pulse 68  Ht 5\' 6"  (1.676 m)  Wt 228 lb 9.6 oz (103.692 kg)  BMI 36.90 kg/m2  LMP 07/12/2011 General: Well-developed, well-nourished, no acute distress HEENT: Sclerae are anicteric, conjunctiva pink. Oral mucosa intact Lungs: Clear Heart: Regular Abdomen: soft, nontender, nondistended, no obvious ascites, no peritoneal signs, normal bowel sounds. No organomegaly. Extremities: No edema Psychiatric: alert and oriented  x3. Cooperative. Anxious     ASSESSMENT:  #1. Serology negative autoimmune hepatitis versus drug-induced hepatitis with autoimmune features on biopsy. Significant improvement of liver function tests after initiation of prednisone. Currently on 20 mg of prednisone. I discussed her case with Dr. Corky Sing, hepatologist at [facility], who agreed with prednisone taper after complete normalization of liver tests and close observation of liver tests thereafter, as we would not want to commit her to long-term immunosuppressive therapy if this were a drug-induced phenomenon. I discussed this with the patient today. #2. GERD. Asymptomatic on pantoprazole   PLAN:  #1. Continue prednisone 20 mg daily #2. Repeat liver tests in 3 weeks #3. Taper prednisone as indicated by liver function tests #4. Continue pantoprazole and reflux precautions #5. Routine office followup in 3 months

## 2011-09-11 NOTE — Telephone Encounter (Signed)
Pt had her labs faxed over that were drawn before she went into the hospital. Pt wanted to make sure Dr. Marina Goodell is aware that she was only taking birth control pills and dexilant at the time these labs were drawn. Pt states that office called her and told her that the labs were fine. When she asked for her results today they told her they were not normal. Dr. Marina Goodell notified and labs placed on his desk.

## 2011-09-12 NOTE — Telephone Encounter (Signed)
Dr. Marina Goodell wants to know if the labs that were drawn July 5th were drawn before of after she received antibiotics. Called and left message for pt to call the office back.

## 2011-09-13 NOTE — Telephone Encounter (Signed)
I spoke with the patient.  The antibiotics were taken prior to the labs.  [name] notified.  He asks that you send him back the labs next week when you get back.

## 2011-09-16 NOTE — Telephone Encounter (Signed)
Labs back to Dr. Marina Goodell for review.

## 2011-10-01 ENCOUNTER — Other Ambulatory Visit (INDEPENDENT_AMBULATORY_CARE_PROVIDER_SITE_OTHER): Payer: 59

## 2011-10-01 DIAGNOSIS — K754 Autoimmune hepatitis: Secondary | ICD-10-CM

## 2011-10-02 ENCOUNTER — Telehealth: Payer: Self-pay

## 2011-10-02 LAB — HEPATIC FUNCTION PANEL: Albumin: 3.8 g/dL (ref 3.5–5.2)

## 2011-10-02 MED ORDER — PREDNISONE 10 MG PO TABS: ORAL_TABLET | ORAL | Status: DC

## 2011-10-02 NOTE — Telephone Encounter (Signed)
Pt aware. Rx sent to pharmacy. Pt states labs may  Be available from Dr. Hall Busing office [phone].

## 2011-10-02 NOTE — Telephone Encounter (Signed)
Message copied by Michele Mcalpine on Wed Oct 02, 2011  3:42 PM ------      Message from: Hilarie Fredrickson      Created: Wed Oct 02, 2011  3:41 PM       LET HER KNOW THAT LFTS CONTINUE TO IMPROVE. NOW IN THE NORMAL RANGE. HAVE HER DECREASE PREDNISONE TO 15 MG DAILY AND REPEAT LFTS IN 4 WEEKS. ALSO, SHE MAY NOT HAVE BUT, SEE IF SHE HAD ANY BLOOD WORK WITH LIVER TESTS IN THE PAST 2 YEARS (PRIOR TO ALL OF THIS) THAT COULD SERVE AS A BASE LINE.

## 2011-10-03 ENCOUNTER — Telehealth: Payer: Self-pay | Admitting: Internal Medicine

## 2011-10-03 NOTE — Telephone Encounter (Signed)
Rx was sent electronically last night but pharmacy states they did not get it. Rx called into pharmacy. Pt called Dr. Arville Care office and they are supposed to fax previous labs today.

## 2011-10-18 NOTE — Telephone Encounter (Signed)
See note on 10/02/2011

## 2011-10-30 ENCOUNTER — Other Ambulatory Visit (INDEPENDENT_AMBULATORY_CARE_PROVIDER_SITE_OTHER): Payer: 59

## 2011-10-30 DIAGNOSIS — R7989 Other specified abnormal findings of blood chemistry: Secondary | ICD-10-CM

## 2011-10-30 LAB — HEPATIC FUNCTION PANEL
AST: 24 U/L (ref 0–37)
Alkaline Phosphatase: 54 U/L (ref 39–117)
Total Bilirubin: 1 mg/dL (ref 0.3–1.2)

## 2011-10-31 ENCOUNTER — Telehealth: Payer: Self-pay

## 2011-10-31 DIAGNOSIS — K754 Autoimmune hepatitis: Secondary | ICD-10-CM

## 2011-10-31 MED ORDER — PANTOPRAZOLE SODIUM 40 MG PO TBEC: 40.0000 mg | DELAYED_RELEASE_TABLET | Freq: Every day | ORAL | Status: DC

## 2011-10-31 NOTE — Telephone Encounter (Signed)
Message copied by Michele Mcalpine on Thu Oct 31, 2011  8:18 AM ------      Message from: Hilarie Fredrickson      Created: Wed Oct 30, 2011  5:15 PM       Let Tarrie know that LFTs continue to improve and remain in the normal range. Have her decrease prednisone to 10 mg daily and repeat LFTs in 4 weeks

## 2011-10-31 NOTE — Telephone Encounter (Signed)
Pt aware. Pt state she only has 2 more Protonix. Refill sent to pharmacy.

## 2011-11-20 ENCOUNTER — Ambulatory Visit (INDEPENDENT_AMBULATORY_CARE_PROVIDER_SITE_OTHER): Payer: 59 | Admitting: Internal Medicine

## 2011-11-20 ENCOUNTER — Encounter: Payer: Self-pay | Admitting: Internal Medicine

## 2011-11-20 VITALS — BP 110/80 | HR 78 | Ht 66.0 in | Wt 234.0 lb

## 2011-11-20 DIAGNOSIS — K219 Gastro-esophageal reflux disease without esophagitis: Secondary | ICD-10-CM

## 2011-11-20 DIAGNOSIS — K754 Autoimmune hepatitis: Secondary | ICD-10-CM

## 2011-11-20 NOTE — Patient Instructions (Addendum)
Please go to basement to have your labs drawn in 3 weeks.  Please schedule a follow visit with Dr. Marina Goodell for late March.  cc: Meredith Mody, MD

## 2011-11-20 NOTE — Progress Notes (Signed)
HISTORY OF PRESENT ILLNESS:  Taylor Howell is a 46 y.o. 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 She was started on prednisone about 4 1/2 months ago, with ongoing significant improvement in liver tests. She was last seen on September 11, 2011. She has a history of GERD for which she takes pantoprazole. Since her last visit she has done well. She is currently on 10 mg of prednisone daily. Her most recent liver tests were in the normal range. Of interest, we were able to obtain outside laboratories. Liver tests in March of 2011 were entirely normal with transaminases in the teens. Liver tests were significantly abnormal BEFORE she received Prevpac. This would mitigate against drug-induced autoimmune hepatitis, as previously considered.  She denies any interval problems. She does mention 15 pound weight gain since initiating prednisone. No problems with abdominal pain or GERD.  REVIEW OF SYSTEMS:  All non-GI ROS negative.  Past Medical History  Diagnosis Date  . Helicobacter pylori (H. pylori)   . Hyperbilirubinemia   . Autoimmune hepatitis     Past Surgical History  Procedure Date  . Cystectomy     Head    Social History Taylor Howell  reports that she has never smoked. She has never used smokeless tobacco. She reports that she does not drink alcohol or use illicit drugs.  family history includes Breast cancer in her maternal aunt and Diabetes in her maternal grandfather.  There is no history of Colon cancer.  No Known Allergies     PHYSICAL EXAMINATION: Vital signs: BP 110/80  Pulse 78  Ht 5\' 6"  (1.676 m)  Wt 234 lb (106.142 kg)  BMI 37.77 kg/m2  LMP 11/01/2011  Constitutional: generally well-appearing, no acute distress Psychiatric: alert and oriented x3, cooperative Eyes: extraocular movements intact, anicteric, conjunctiva pink Mouth: oral pharynx moist, no  lesions Neck: supple no lymphadenopathy Cardiovascular: heart regular rate and rhythm, no murmur Lungs: clear to auscultation bilaterally Abdomen: soft, nontender, nondistended, no obvious ascites, no peritoneal signs, normal bowel sounds, no organomegaly Extremities: no lower extremity edema bilaterally Skin: no lesions on visible extremities Neuro:  No asterixis.    ASSESSMENT:  #1. Autoimmune hepatitis (serology negative), with supportive biopsy and response to prednisone. Clinically well with liver tests in the normal range, though not at her baseline. We seemed to be able to drug-induced pathology based on the timetable of outside laboratories. #2. GERD. Asymptomatic on PPI   PLAN:  #1. Continue prednisone 10 mg daily. Hopefully can continue to reduce her prednisone dosage as long as the liver tests continue to remain normal/improved. We may want to repeat biopsy this summer, prior to considering complete discontinuation of steroids. She understands. #2. Repeat liver tests in 3 weeks #3. Routine office followup in 3 months. Contact office in the interim for questions or problems #4. Continue PPI

## 2011-12-12 ENCOUNTER — Other Ambulatory Visit (INDEPENDENT_AMBULATORY_CARE_PROVIDER_SITE_OTHER): Payer: 59

## 2011-12-12 LAB — HEPATIC FUNCTION PANEL
ALT: 26 U/L (ref 0–35)
AST: 24 U/L (ref 0–37)
Albumin: 4.2 g/dL (ref 3.5–5.2)
Total Bilirubin: 0.6 mg/dL (ref 0.3–1.2)

## 2011-12-13 ENCOUNTER — Other Ambulatory Visit: Payer: Self-pay | Admitting: Internal Medicine

## 2011-12-13 ENCOUNTER — Telehealth: Payer: Self-pay

## 2011-12-13 NOTE — Telephone Encounter (Signed)
Spoke with pt and she is aware.

## 2011-12-13 NOTE — Telephone Encounter (Signed)
Message copied by Michele Mcalpine on Fri Dec 13, 2011  8:12 AM ------      Message from: Hilarie Fredrickson      Created: Thu Dec 12, 2011  6:07 PM       Tell Jo-Anne that lft's remain normal and unchanged. Decrease prednisone to 5 mg daily and repeat lft's in 6 weeks.

## 2012-01-07 ENCOUNTER — Telehealth: Payer: Self-pay | Admitting: Internal Medicine

## 2012-01-07 NOTE — Telephone Encounter (Signed)
There is no particular reason, based on her condition, that she could not take over-the-counter symptomatic therapies for her cold.

## 2012-01-07 NOTE — Telephone Encounter (Signed)
Pt aware.

## 2012-01-07 NOTE — Telephone Encounter (Signed)
Pt states that she has had lots of "head congestion" for the past 4-5 days. She has been scared to take anything over the counter due to the problems she had with her liver. Pt wants to know what she can take. Dr. Marina Goodell please advise.

## 2012-01-24 ENCOUNTER — Other Ambulatory Visit (INDEPENDENT_AMBULATORY_CARE_PROVIDER_SITE_OTHER): Payer: 59

## 2012-01-24 ENCOUNTER — Other Ambulatory Visit: Payer: Self-pay | Admitting: Internal Medicine

## 2012-01-24 DIAGNOSIS — K754 Autoimmune hepatitis: Secondary | ICD-10-CM

## 2012-01-24 LAB — HEPATIC FUNCTION PANEL
ALT: 25 U/L (ref 0–35)
Total Bilirubin: 0.6 mg/dL (ref 0.3–1.2)

## 2012-01-24 MED ORDER — PREDNISONE 5 MG PO TABS: 2.5000 mg | ORAL_TABLET | Freq: Every day | ORAL | Status: AC

## 2012-02-10 ENCOUNTER — Encounter: Payer: Self-pay | Admitting: Internal Medicine

## 2012-02-10 ENCOUNTER — Ambulatory Visit (INDEPENDENT_AMBULATORY_CARE_PROVIDER_SITE_OTHER): Payer: 59 | Admitting: Internal Medicine

## 2012-02-10 VITALS — BP 112/80 | HR 64 | Ht 66.0 in | Wt 233.4 lb

## 2012-02-10 DIAGNOSIS — K754 Autoimmune hepatitis: Secondary | ICD-10-CM

## 2012-02-10 DIAGNOSIS — K219 Gastro-esophageal reflux disease without esophagitis: Secondary | ICD-10-CM

## 2012-02-10 NOTE — Progress Notes (Signed)
HISTORY OF PRESENT ILLNESS:  Taylor Howell is a 46 y.o. female with serology negative autoimmune hepatitis (biopsy 06-13-11) and GERD. She presents today for followup. She has been on a steady prednisone taper. She is currently on prednisone 2.5 mg daily (over the past 2 weeks). Her liver tests are normal. She has no active complaints. For her reflux disease, she is using TPI on demand. GI review of systems is negative. She is exercising. Planning a vacation.  REVIEW OF SYSTEMS:  All non-GI ROS negative.  Past Medical History  Diagnosis Date  . Helicobacter pylori (H. pylori)   . Hyperbilirubinemia   . Autoimmune hepatitis     Past Surgical History  Procedure Date  . Cystectomy     Head    Social History Taylor Howell  reports that she has never smoked. She has never used smokeless tobacco. She reports that she does not drink alcohol or use illicit drugs.  family history includes Breast cancer in her maternal aunt and Diabetes in her maternal grandfather.  There is no history of Colon cancer.  No Known Allergies     PHYSICAL EXAMINATION: Vital signs: BP 112/80  Pulse 64  Ht 5\' 6"  (1.676 m)  Wt 233 lb 6.4 oz (105.87 kg)  BMI 37.67 kg/m2  LMP 01/16/2012 General: Well-developed, well-nourished, no acute distress HEENT: Sclerae are anicteric, conjunctiva pink. Oral mucosa intact Lungs: Clear Heart: Regular Abdomen: soft,obese, nontender, nondistended, no obvious ascites, no peritoneal signs, normal bowel sounds. No organomegaly. Extremities: No edema Psychiatric: alert and oriented x3. Cooperative      ASSESSMENT:  #1. Autoimmune hepatitis (serology negative) with supportive biopsy and response to prednisone. Clinically well with normal liver tests near baseline. We seemed to have ruled out drug-induced hepatitis based on timetable review of outside records #2. GERD. Controlled with on demand PPI   PLAN:  #1. Continue prednisone 2.5 mg daily for 4 weeks then  discontinue #2. Liver function tests once monthly until otherwise directed #3. Reflux precautions with attention to weight loss #4. PPI on demand #5. Routine office followup in 6 months

## 2012-02-10 NOTE — Patient Instructions (Signed)
Continue to take your Prednisone as prescribed and have your liver checked in one month

## 2012-02-26 ENCOUNTER — Telehealth: Payer: Self-pay | Admitting: Internal Medicine

## 2012-02-26 NOTE — Telephone Encounter (Signed)
pepto and imodium are fine. Brat diet. See PCP or one of our extenders if problem persists despite time and supportive measures

## 2012-02-26 NOTE — Telephone Encounter (Signed)
Spoke with pt and she is aware.

## 2012-02-26 NOTE — Telephone Encounter (Signed)
Pt states that she woke up this morning with abdominal cramping. Reports that she has vomited twice and had some diarrhea. Pt wanted to know what she can take. Pt wanted to know if she could take pepto-bismol. Pt instructed to take Imodium for the diarrhea. Any other recommendations? Please advise.

## 2012-03-09 ENCOUNTER — Other Ambulatory Visit: Payer: Self-pay | Admitting: Internal Medicine

## 2012-03-09 ENCOUNTER — Other Ambulatory Visit (INDEPENDENT_AMBULATORY_CARE_PROVIDER_SITE_OTHER): Payer: 59

## 2012-03-09 DIAGNOSIS — K754 Autoimmune hepatitis: Secondary | ICD-10-CM

## 2012-03-09 LAB — HEPATIC FUNCTION PANEL
ALT: 19 U/L (ref 0–35)
Bilirubin, Direct: 0.1 mg/dL (ref 0.0–0.3)
Total Bilirubin: 0.6 mg/dL (ref 0.3–1.2)

## 2012-04-14 ENCOUNTER — Telehealth: Payer: Self-pay | Admitting: Internal Medicine

## 2012-04-14 NOTE — Telephone Encounter (Signed)
Pt states she was sick all last week and had a lot of congestion. She was given amoxicillin and reports that it made her sick, nauseated. Her PCP gave her a Z-pack but the pt has not started taking it because it mentioned it may cause severe liver problems. She wanted to get Dr. Lamar Sprinkles opinion before starting it. Please advise.

## 2012-04-14 NOTE — Telephone Encounter (Signed)
If she absolutely needs the Z-pak, ok. Can't promise no liver reaction, but her risk should be that of the general population

## 2012-04-14 NOTE — Telephone Encounter (Signed)
Spoke with pt and she is aware.

## 2012-04-20 ENCOUNTER — Other Ambulatory Visit: Payer: Self-pay | Admitting: Internal Medicine

## 2012-04-20 ENCOUNTER — Other Ambulatory Visit (INDEPENDENT_AMBULATORY_CARE_PROVIDER_SITE_OTHER): Payer: 59

## 2012-04-20 DIAGNOSIS — K754 Autoimmune hepatitis: Secondary | ICD-10-CM

## 2012-04-20 LAB — HEPATIC FUNCTION PANEL
ALT: 17 U/L (ref 0–35)
AST: 18 U/L (ref 0–37)
Bilirubin, Direct: 0.1 mg/dL (ref 0.0–0.3)
Total Protein: 7.2 g/dL (ref 6.0–8.3)

## 2012-04-21 ENCOUNTER — Telehealth: Payer: Self-pay | Admitting: Internal Medicine

## 2012-04-22 ENCOUNTER — Other Ambulatory Visit: Payer: Self-pay

## 2012-04-22 MED ORDER — PANTOPRAZOLE SODIUM 40 MG PO TBEC: 40.0000 mg | DELAYED_RELEASE_TABLET | Freq: Every day | ORAL | Status: DC

## 2012-04-22 NOTE — Telephone Encounter (Signed)
Refilled protonix

## 2012-04-27 MED ORDER — PANTOPRAZOLE SODIUM 40 MG PO TBEC: 40.0000 mg | DELAYED_RELEASE_TABLET | Freq: Every day | ORAL | Status: DC

## 2012-06-11 ENCOUNTER — Other Ambulatory Visit (INDEPENDENT_AMBULATORY_CARE_PROVIDER_SITE_OTHER): Payer: 59

## 2012-06-11 ENCOUNTER — Telehealth: Payer: Self-pay | Admitting: *Deleted

## 2012-06-11 DIAGNOSIS — K754 Autoimmune hepatitis: Secondary | ICD-10-CM

## 2012-06-11 LAB — HEPATIC FUNCTION PANEL
Alkaline Phosphatase: 73 U/L (ref 39–117)
Bilirubin, Direct: 0.1 mg/dL (ref 0.0–0.3)
Total Bilirubin: 0.8 mg/dL (ref 0.3–1.2)
Total Protein: 7.4 g/dL (ref 6.0–8.3)

## 2012-06-11 NOTE — Telephone Encounter (Signed)
Discussed with pt that Dr. Marina Goodell is out of town and that I will call her back with his recommendations when he returns. Pt verbalized understanding.

## 2012-06-15 ENCOUNTER — Other Ambulatory Visit: Payer: Self-pay | Admitting: Internal Medicine

## 2012-06-15 NOTE — Telephone Encounter (Signed)
Pt aware, see results note.

## 2012-09-10 ENCOUNTER — Other Ambulatory Visit: Payer: Self-pay | Admitting: Internal Medicine

## 2012-09-10 ENCOUNTER — Other Ambulatory Visit (INDEPENDENT_AMBULATORY_CARE_PROVIDER_SITE_OTHER): Payer: 59

## 2012-09-10 DIAGNOSIS — R7989 Other specified abnormal findings of blood chemistry: Secondary | ICD-10-CM

## 2012-09-10 LAB — HEPATIC FUNCTION PANEL
ALT: 19 U/L (ref 0–35)
AST: 22 U/L (ref 0–37)
Albumin: 3.6 g/dL (ref 3.5–5.2)
Alkaline Phosphatase: 76 U/L (ref 39–117)
Bilirubin, Direct: 0.1 mg/dL (ref 0.0–0.3)
Total Protein: 7.1 g/dL (ref 6.0–8.3)

## 2012-11-30 ENCOUNTER — Other Ambulatory Visit (INDEPENDENT_AMBULATORY_CARE_PROVIDER_SITE_OTHER): Payer: 59

## 2012-11-30 DIAGNOSIS — R7989 Other specified abnormal findings of blood chemistry: Secondary | ICD-10-CM

## 2012-11-30 LAB — HEPATIC FUNCTION PANEL
Albumin: 3.8 g/dL (ref 3.5–5.2)
Total Protein: 7.2 g/dL (ref 6.0–8.3)

## 2012-12-01 ENCOUNTER — Other Ambulatory Visit: Payer: Self-pay | Admitting: Internal Medicine

## 2012-12-01 DIAGNOSIS — K754 Autoimmune hepatitis: Secondary | ICD-10-CM

## 2013-03-03 ENCOUNTER — Other Ambulatory Visit (INDEPENDENT_AMBULATORY_CARE_PROVIDER_SITE_OTHER): Payer: 59

## 2013-03-03 DIAGNOSIS — R7989 Other specified abnormal findings of blood chemistry: Secondary | ICD-10-CM

## 2013-03-03 LAB — HEPATIC FUNCTION PANEL
AST: 20 U/L (ref 0–37)
Alkaline Phosphatase: 86 U/L (ref 39–117)
Bilirubin, Direct: 0.1 mg/dL (ref 0.0–0.3)
Total Bilirubin: 0.7 mg/dL (ref 0.3–1.2)

## 2013-03-04 ENCOUNTER — Other Ambulatory Visit: Payer: Self-pay

## 2013-05-06 IMAGING — CT CT ABD-PEL WO/W CM
3 of 10 series · 12 of 46 positions shown, 18 images · IV contrast (agent unspecified)
Comparison: None.

CLINICAL DATA: Increased liver function test.  Pancreatitis.

CT ABDOMEN AND PELVIS WITHOUT AND WITH CONTRAST
TECHNIQUE: Multidetector CT imaging of the abdomen and pelvis was
performed without contrast material in one or both body regions,
followed by contrast material(s) and further sections in one or
both body regions.
Contrast: 100 ml [id number].

[Series 2: abd w/o 5.0 b31f st · axial · non-contrast · 0.75mm/px · z∈[+442,+578]mm · 3 of 96 slices shown]
[im 14/96  soft-tissue]
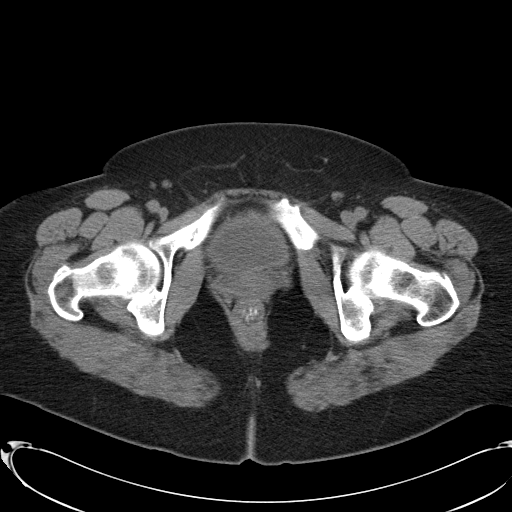
[im 28/96  soft-tissue]
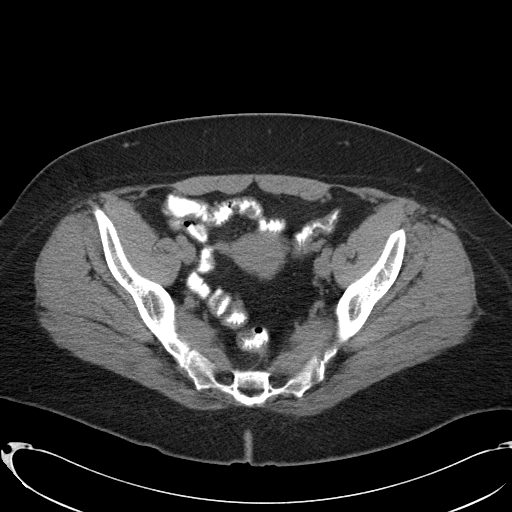
[im 41/96  soft-tissue]
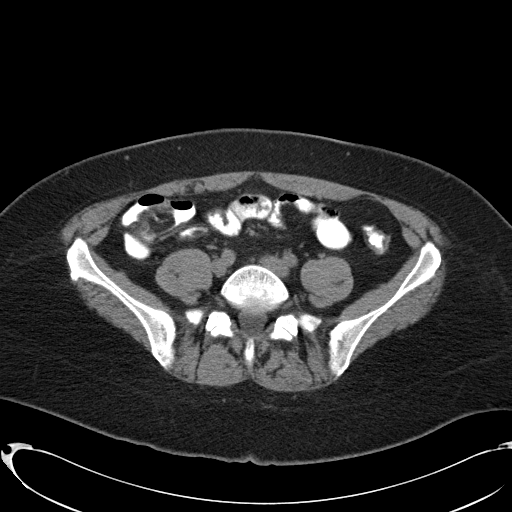

[Series 6: venous 5.0 b31f st · axial · portal-venous · 0.75mm/px · z∈[+442,+782]mm · 6 of 96 slices shown, 11 images]
[im 14/96  soft-tissue]
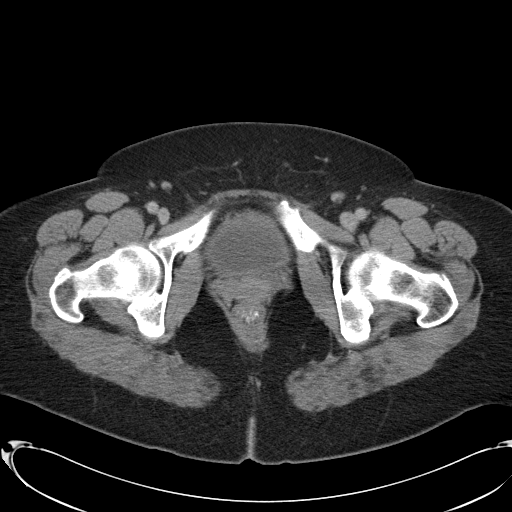
[im 14/96  bone]
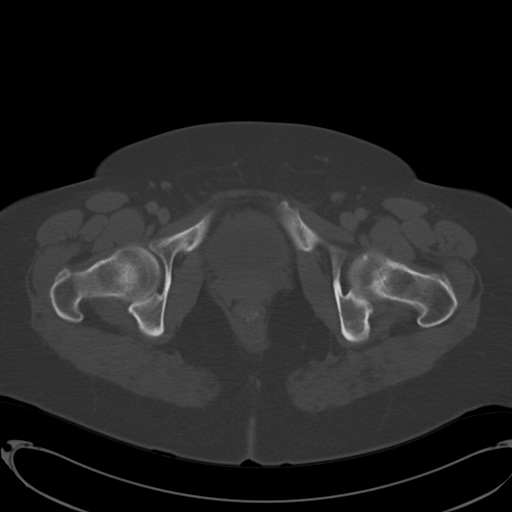
[im 28/96  soft-tissue]
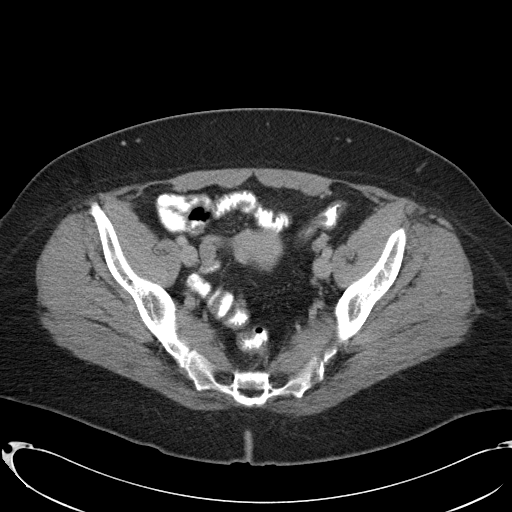
[im 41/96  soft-tissue]
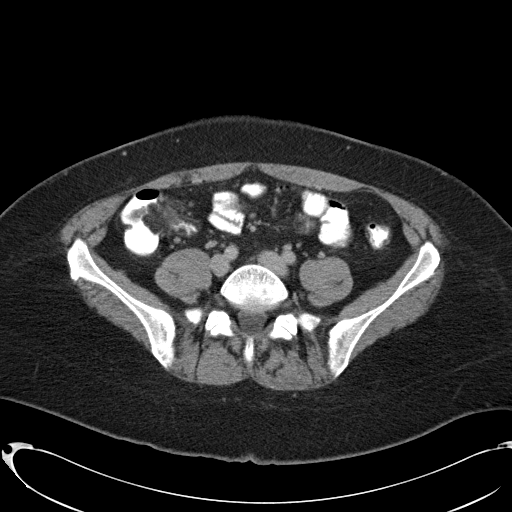
[im 41/96  lung]
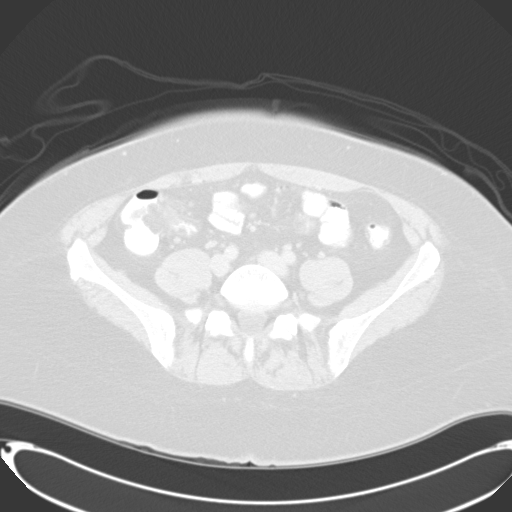
[im 55/96  soft-tissue]
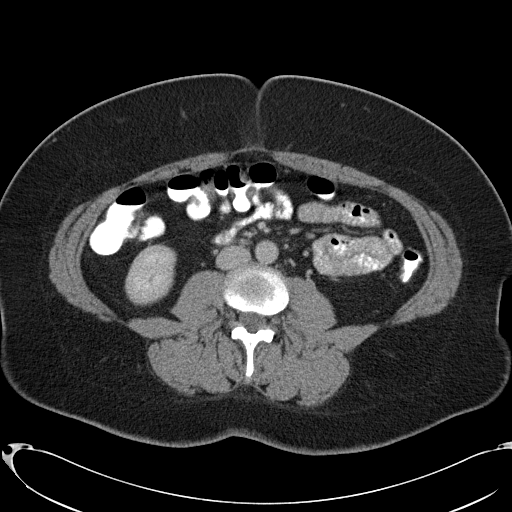
[im 55/96  lung]
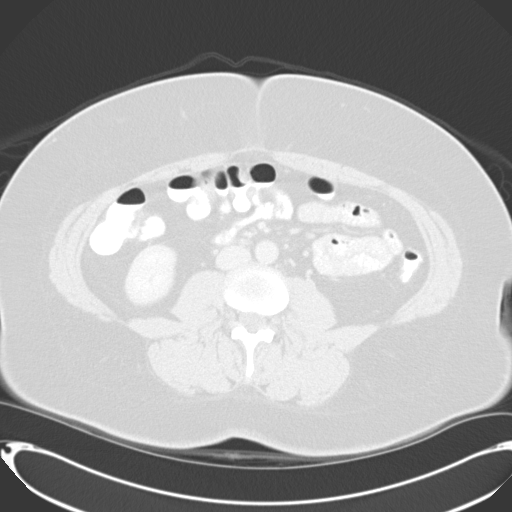
[im 68/96  soft-tissue]
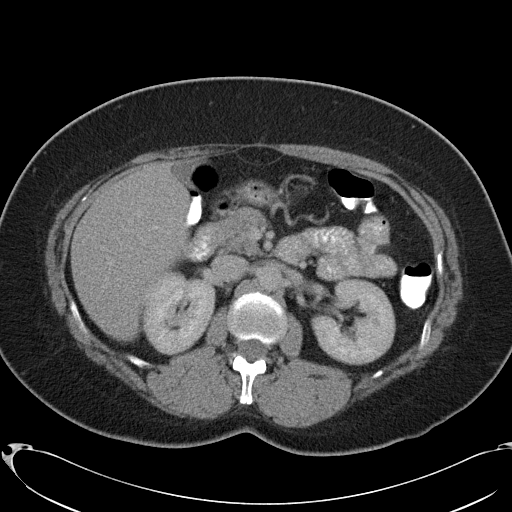
[im 68/96  lung]
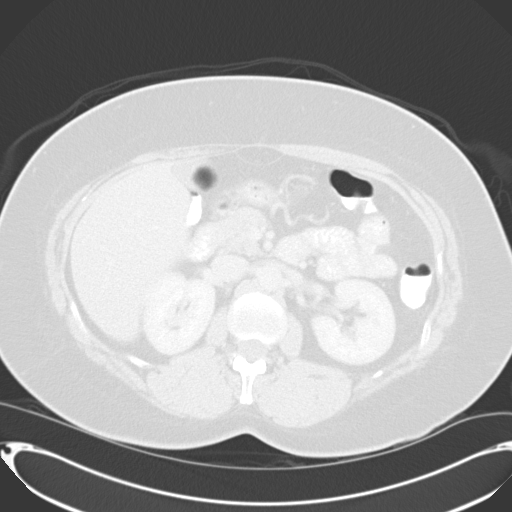
[im 82/96  soft-tissue]
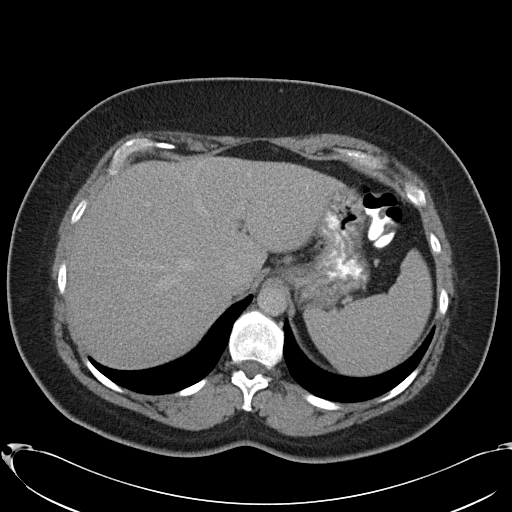
[im 82/96  lung]
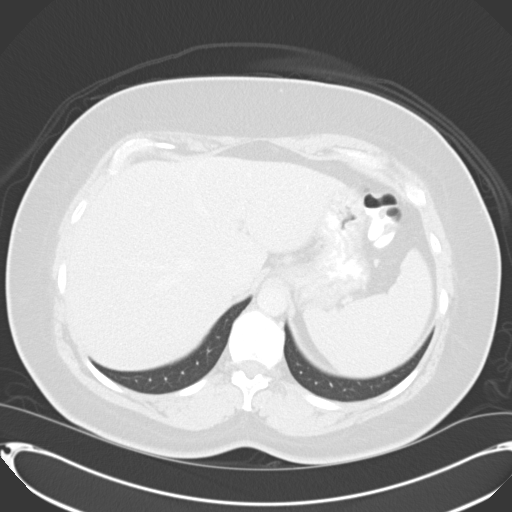

[Series 602: coronal 35 sec · coronal · 0.76mm/px · 3 of 79 slices shown, 4 images]
[im 20/79  soft-tissue]
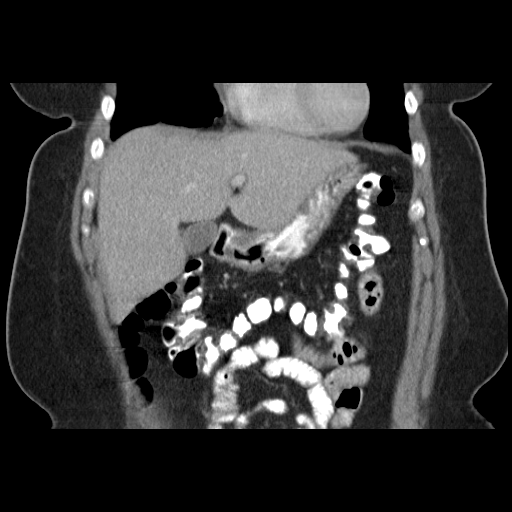
[im 40/79  soft-tissue]
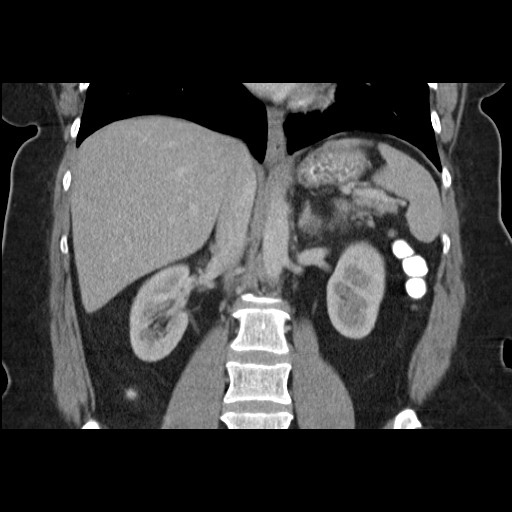
[im 40/79  bone]
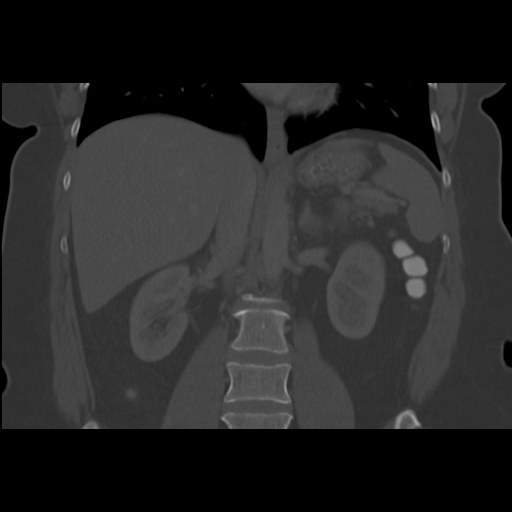
[im 59/79  soft-tissue]
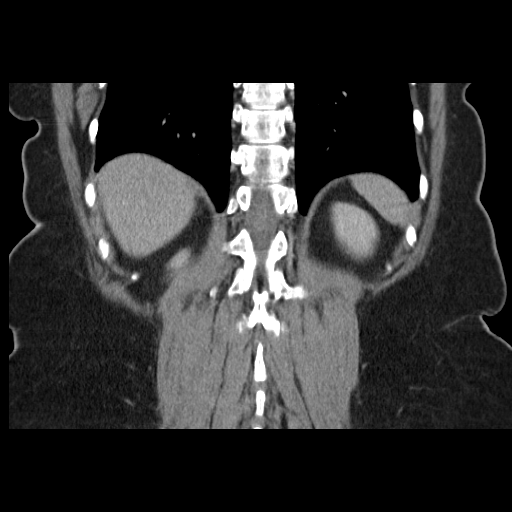

[12 of 46 positions shown; findings below may reference images not displayed]

FINDINGS: Pancreatic protocol CT was performed.  The lung bases are
normal.  Liver and gallbladder normal.  Pancreas and common bile
duct normal.  Spleen normal.  There are enlarged lymph nodes in the
porta hepatis.  Hepatic duodenal lymph node measures 21 mm short
axis.  12 mm porta hepatis lymph node is present.  The source for
the lymphadenopathy is unclear.  There is no peripancreatic fat
stranding.  No pancreatic mass.  No intra or extrahepatic biliary
ductal dilation.  The stomach is decompressed.  Spleen
unremarkable.  Visualized small and large bowel within normal
limits.  Kidneys demonstrate normal enhancement.  Normal delayed
excretion of contrast from the kidneys.  Right anterior abdominal
wall subcutaneous injection site.

Uterus and adnexa normal.  There is a partially calcified nodule in
the right perirectal fat measuring 9 mm x 19 mm.  Calcified
phleboliths are present in the anatomic pelvis.  Mild colonic
diverticulosis.  No diverticulitis.   Bones appear within normal
limits with scattered bone islands. Mild thickening of the wall of
the urinary bladder is probably due to under distention.
IMPRESSION: 1.  No CT evidence of pancreatitis, intra or extrahepatic biliary
ductal dilation.  The pancreatic duct is normal.  No pancreatic
mass.
2.  Porta hepatis and hepatic duodenal lymphadenopathy without
cause identified.  This could be associated with
lymphoproliferative disorder or be reactive to the inflammatory
process such as hepatitis. Neoplastic adenopathy cannot be
excluded.  Clinically correlate.
3.  Small peripherally calcified nodule in the right perirectal fat
is probably benign but is nonspecific.

## 2013-05-17 ENCOUNTER — Other Ambulatory Visit: Payer: Self-pay | Admitting: Internal Medicine

## 2013-05-19 ENCOUNTER — Telehealth: Payer: Self-pay | Admitting: Internal Medicine

## 2013-05-20 IMAGING — US US BIOPSY
1 series · 13 of 15 positions shown · non-contrast
Comparison: none

Clinical: Increased LFTs.

ULTRASOUND-GUIDED RANDOM CORE LIVER BIOPSY:
An ultrasound guided liver biopsy was thoroughly discussed with the
patient and questions were answered. The benefits, risks,
alternatives, and complications were also discussed. The patient
understands and wishes to proceed with the procedure. A verbal as
well as written consent was obtained.
Ultrasound imaging of the liver was performed and an appropriate
skin entry site was determined. Skin site was marked, prepped and
draped in the usual sterile fashion. 1% Lidocaine was infiltrated
locally. A 17 gauge Trocar needle was advanced under ultrasound
guidance into the liver. Imaging was obtained documenting
appropriate needle position.  Three coaxial 18 gauge core samples
were then obtained and sent to the laboratory for further analysis.
Post procedure scans demonstrate no evidence of bleeding or
hematoma.  The patient tolerated the procedure well with no
immediate complication.
Cardiac and respiratory monitoring was provided by the radiology
RN.  Moderate sedation in the form of 75 fentanyl and 1.5 of versed
were administered throughout the procedure for a total of 12
minutes.

[Series 1: us biopsy · 0.26mm/px · 13 of 15 slices shown]
[im 1/15]
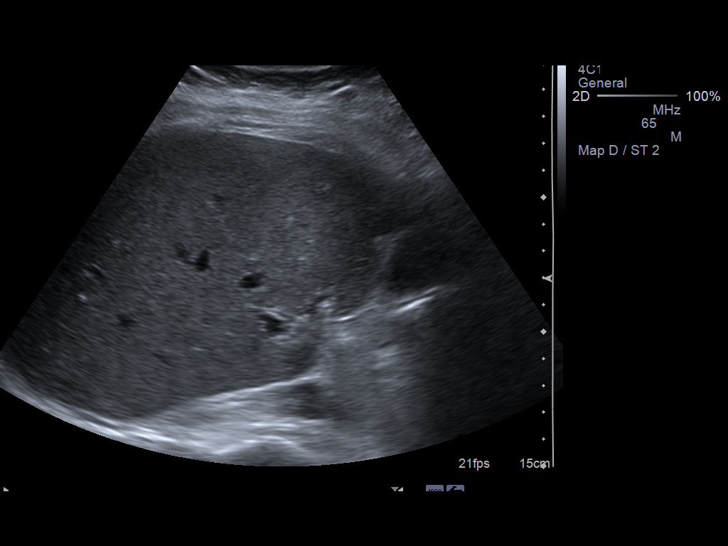
[im 2/15]
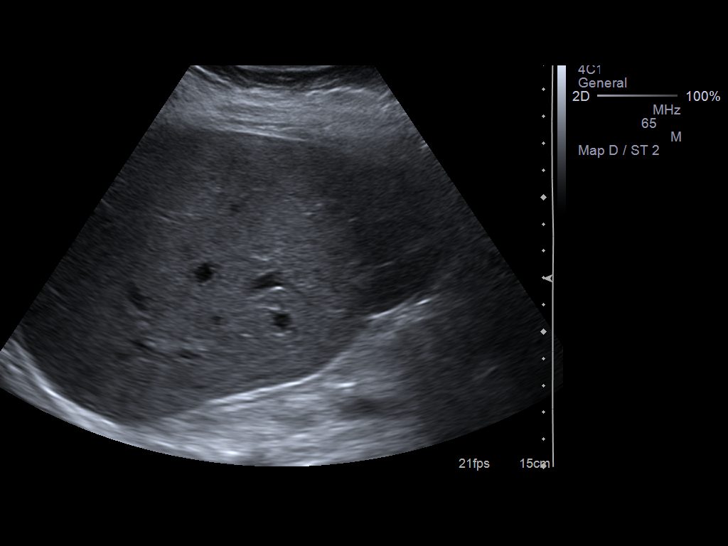
[im 3/15]
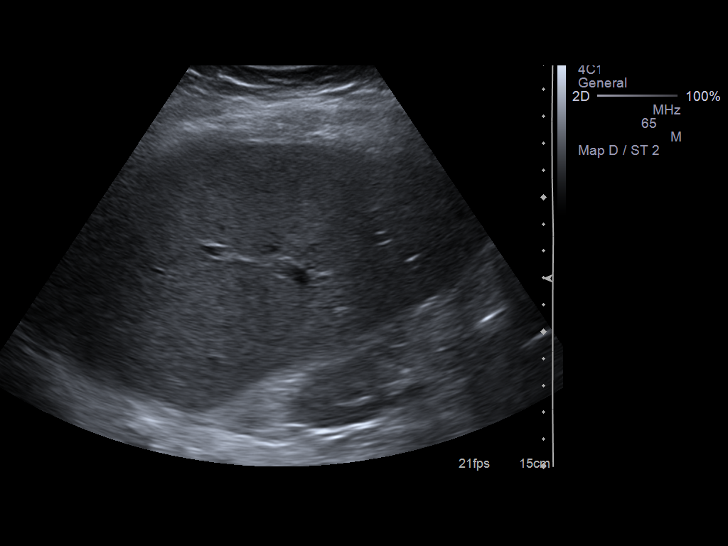
[im 5/15]
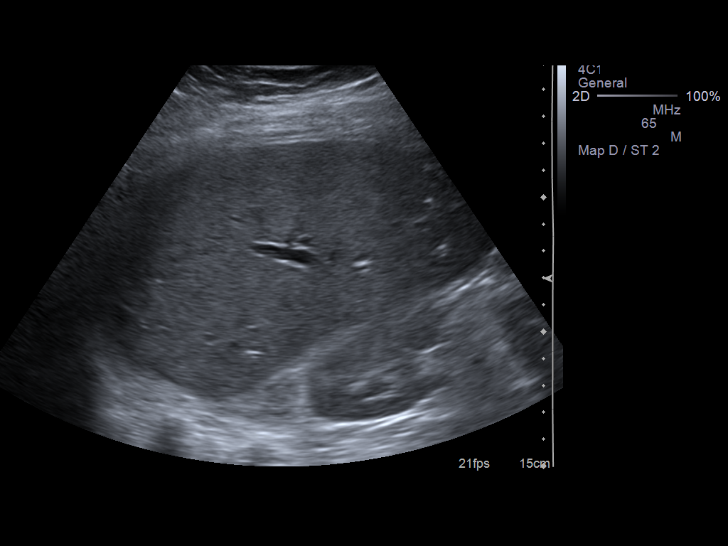
[im 6/15]
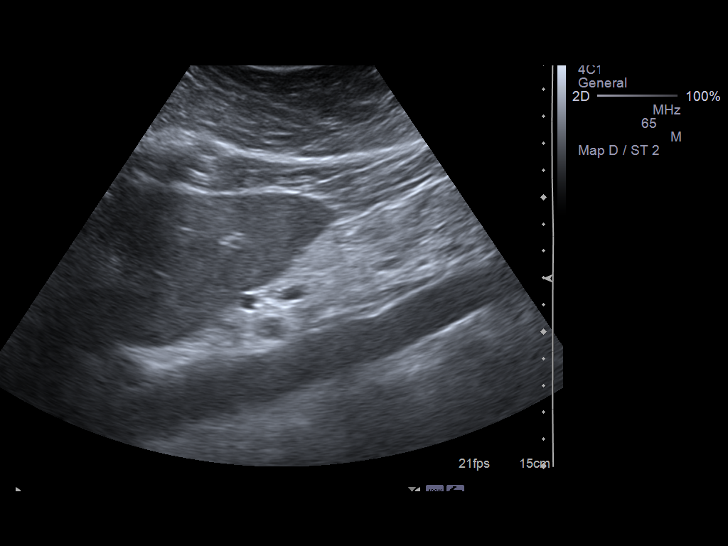
[im 7/15]
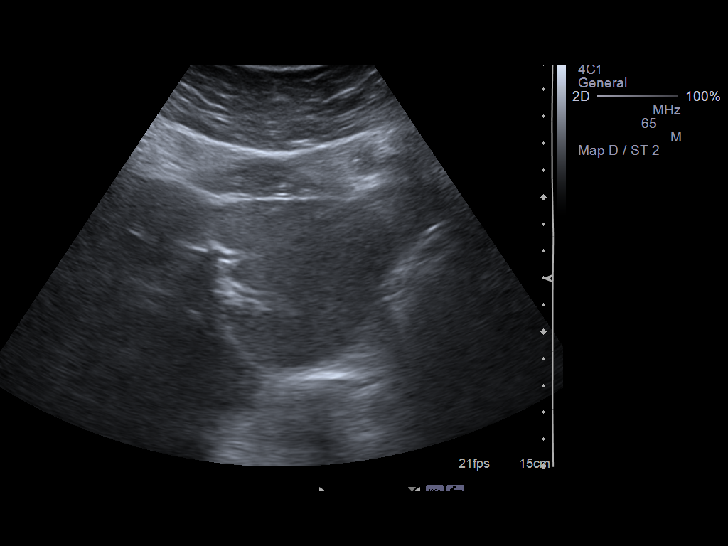
[im 8/15]
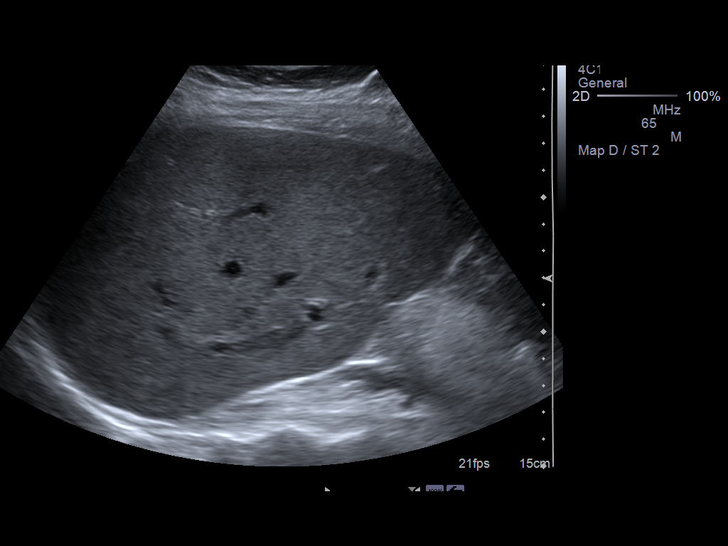
[im 9/15]
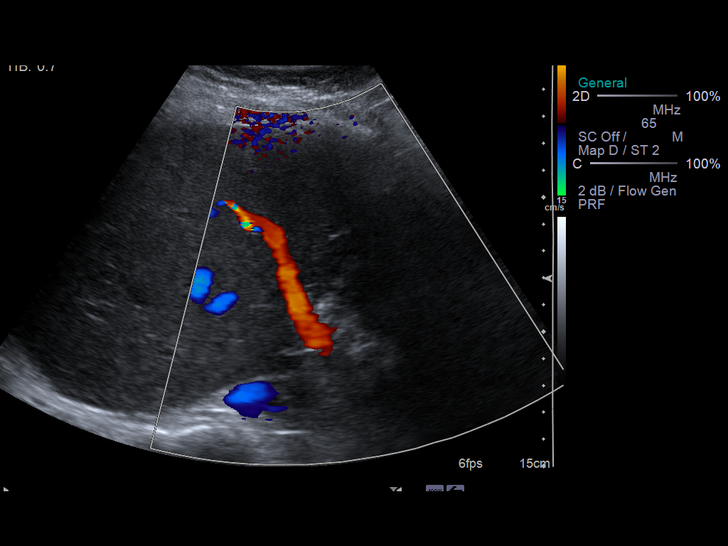
[im 10/15]
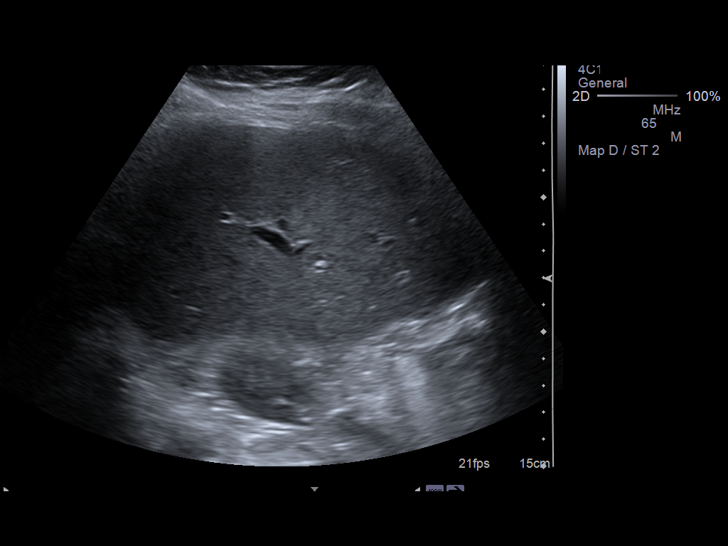
[im 11/15]
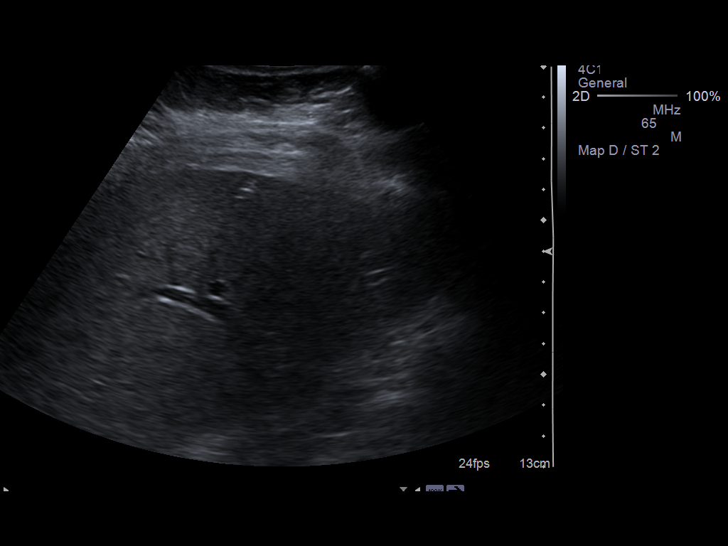
[im 13/15]
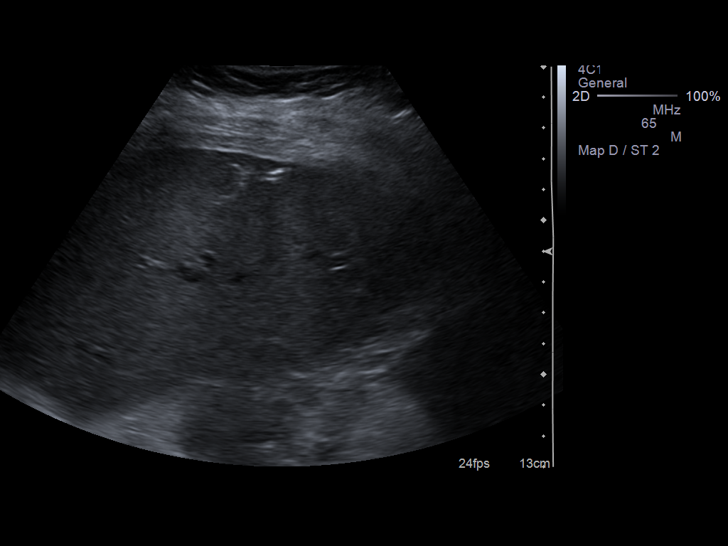
[im 14/15]
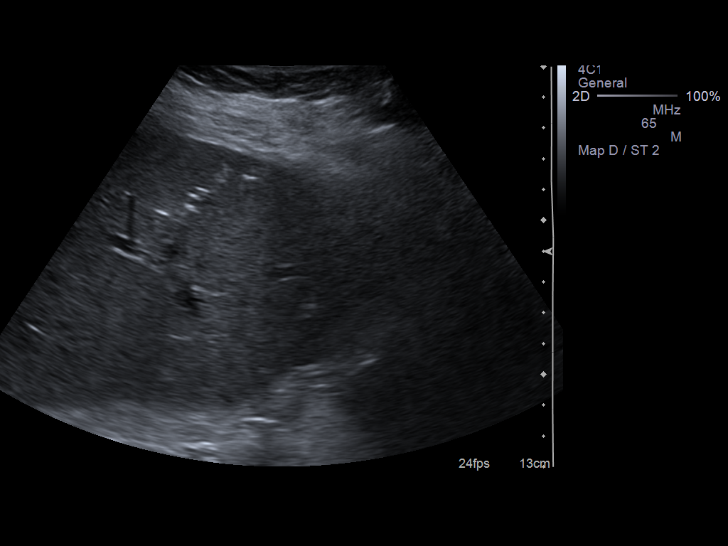
[im 15/15]
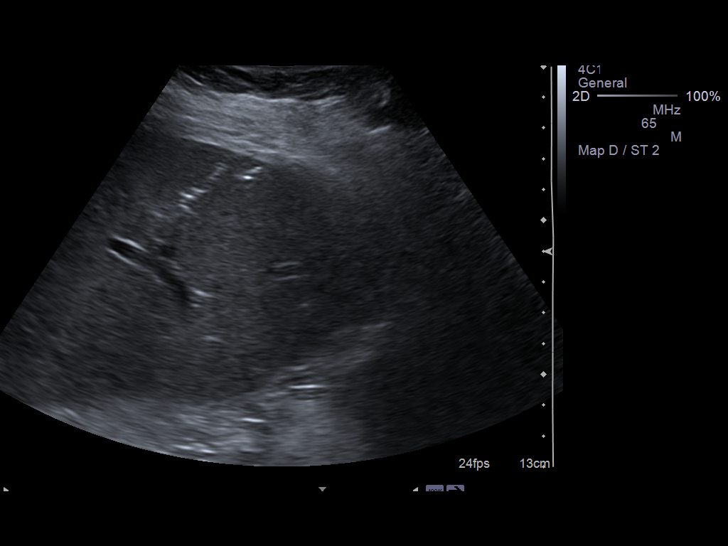

[13 of 15 positions shown; findings below may reference images not displayed]

IMPRESSION: 1. Technically successful ultrasound guided random liver core
biopsy with moderate sedation as described above.

Read by: Sinchi, Ollie.-ANDERS

## 2013-06-11 NOTE — Telephone Encounter (Signed)
Patient was under the impression she did not have to come in for an o/v as long as she was getting regular labs.  I told her I would send Dr. Marina Goodell a note to see when he wanted to see her next.

## 2013-06-14 ENCOUNTER — Telehealth: Payer: Self-pay | Admitting: Gastroenterology

## 2013-06-14 DIAGNOSIS — K754 Autoimmune hepatitis: Secondary | ICD-10-CM

## 2013-06-14 MED ORDER — PANTOPRAZOLE SODIUM 40 MG PO TBEC: 40.0000 mg | DELAYED_RELEASE_TABLET | Freq: Every day | ORAL | Status: DC

## 2013-06-14 NOTE — Telephone Encounter (Signed)
Called pt lvm telling her refill sent in and she is expected to have labs drawn in October. If she has any questions to call us back

## 2013-06-14 NOTE — Telephone Encounter (Signed)
Message copied by Richardo Hanks on Mon Jun 14, 2013  3:34 PM ------      Message from: Hilarie Fredrickson      Created: Mon Jun 14, 2013  8:56 AM       Ok to refill. Should have LFTs checked in October. Plan follow up OV around March 2015. Thanks       ----- Message -----         From: Jeanine Luz, CMA         Sent: 06/11/2013  10:24 AM           To: Hilarie Fredrickson, MD            Patient has not been seen in office since 01/2012 (auto-immune hepatitis)  - instructions told her to follow up in 6 months, which she did not.  She wants her Protonix refilled and states that she was under the impression she did not have to come in as long as she continued to have her labs drawn as instructed.  She states that she feels good and has no problems.  Just wanted to check if she needed to come in immediately or if she could wait.       ------

## 2013-06-25 ENCOUNTER — Telehealth: Payer: Self-pay

## 2013-06-25 DIAGNOSIS — K754 Autoimmune hepatitis: Secondary | ICD-10-CM

## 2013-06-25 MED ORDER — PANTOPRAZOLE SODIUM 40 MG PO TBEC: 40.0000 mg | DELAYED_RELEASE_TABLET | Freq: Every day | ORAL | Status: DC

## 2013-06-25 NOTE — Telephone Encounter (Signed)
Lm that I sent her rx for Protonix to her pharmacy and reiterated Dr. Lamar Sprinkles instructions that she have her LFT's checked in October and follow up with an OV in March 2015

## 2013-06-25 NOTE — Telephone Encounter (Signed)
Message copied by Jeanine Luz on Fri Jun 25, 2013  3:46 PM ------      Message from: Hilarie Fredrickson      Created: Mon Jun 14, 2013  8:56 AM       Ok to refill. Should have LFTs checked in October. Plan follow up OV around March 2015. Thanks       ----- Message -----         From: Jeanine Luz, CMA         Sent: 06/11/2013  10:24 AM           To: Hilarie Fredrickson, MD            Patient has not been seen in office since 01/2012 (auto-immune hepatitis)  - instructions told her to follow up in 6 months, which she did not.  She wants her Protonix refilled and states that she was under the impression she did not have to come in as long as she continued to have her labs drawn as instructed.  She states that she feels good and has no problems.  Just wanted to check if she needed to come in immediately or if she could wait.       ------

## 2013-08-26 ENCOUNTER — Other Ambulatory Visit (INDEPENDENT_AMBULATORY_CARE_PROVIDER_SITE_OTHER): Payer: 59

## 2013-08-26 DIAGNOSIS — K754 Autoimmune hepatitis: Secondary | ICD-10-CM

## 2013-08-26 LAB — HEPATIC FUNCTION PANEL
ALT: 21 U/L (ref 0–35)
Total Bilirubin: 0.6 mg/dL (ref 0.3–1.2)

## 2014-01-17 ENCOUNTER — Telehealth: Payer: Self-pay | Admitting: Internal Medicine

## 2014-01-17 NOTE — Telephone Encounter (Signed)
Pt aware.

## 2014-01-17 NOTE — Telephone Encounter (Signed)
Pt has been having some pain in her foot and was prescribed Vimovo 500mg  to take BID (Naproxen and esomeprazole). Pt states there is a warning on the bottle regarding possible liver damage. Pt wants to know if she can take this. Please advise.

## 2014-01-17 NOTE — Telephone Encounter (Signed)
Ok to take

## 2014-02-21 ENCOUNTER — Telehealth: Payer: Self-pay

## 2014-02-21 NOTE — Telephone Encounter (Signed)
Message copied by Chrystie NoseHUNT, Yaseen Gilberg R on Mon Feb 21, 2014 12:10 PM ------      Message from: Jujhar Everett, [place]      Created: Thu Aug 26, 2013  4:32 PM      Regarding: LFT's       Pt needs LFT's in April ------

## 2014-02-21 NOTE — Telephone Encounter (Signed)
Pt aware and order in epic. 

## 2014-03-14 ENCOUNTER — Other Ambulatory Visit: Payer: Self-pay

## 2014-03-14 ENCOUNTER — Other Ambulatory Visit (INDEPENDENT_AMBULATORY_CARE_PROVIDER_SITE_OTHER): Payer: 59

## 2014-03-14 DIAGNOSIS — R7989 Other specified abnormal findings of blood chemistry: Secondary | ICD-10-CM

## 2014-03-14 DIAGNOSIS — R945 Abnormal results of liver function studies: Principal | ICD-10-CM

## 2014-03-14 DIAGNOSIS — K754 Autoimmune hepatitis: Secondary | ICD-10-CM

## 2014-03-14 LAB — HEPATIC FUNCTION PANEL
ALBUMIN: 4.2 g/dL (ref 3.5–5.2)
ALK PHOS: 72 U/L (ref 39–117)
ALT: 19 U/L (ref 0–35)
AST: 20 U/L (ref 0–37)
Bilirubin, Direct: 0.1 mg/dL (ref 0.0–0.3)
TOTAL PROTEIN: 7.1 g/dL (ref 6.0–8.3)
Total Bilirubin: 0.6 mg/dL (ref 0.3–1.2)

## 2014-06-25 ENCOUNTER — Other Ambulatory Visit: Payer: Self-pay | Admitting: Internal Medicine

## 2014-06-29 ENCOUNTER — Telehealth: Payer: Self-pay | Admitting: Internal Medicine

## 2014-06-29 DIAGNOSIS — K754 Autoimmune hepatitis: Secondary | ICD-10-CM

## 2014-06-30 MED ORDER — PANTOPRAZOLE SODIUM 40 MG PO TBEC: 40.0000 mg | DELAYED_RELEASE_TABLET | Freq: Every day | ORAL | Status: DC

## 2014-06-30 NOTE — Telephone Encounter (Signed)
Pt was supposed to schedule and OV with Dr. Marina GoodellPerry in March of 2015 per refill note in August of 2014. Pt does need an OV.

## 2014-06-30 NOTE — Telephone Encounter (Signed)
Patient wanted refills on Protonix. I explained to her that it looks like she needs an appointment. Has not been seen in over 2 years. She stated that Bonita QuinLinda told her she did not need to follow up as long as she was keeping up with her labs. I still told her I believed she would need to follow up for the refills  I refilled and told her I would pass the message on to Selinda MichaelsLinda Hunt to see if she needs a follow up appt for med refills

## 2014-08-09 ENCOUNTER — Other Ambulatory Visit: Payer: Self-pay

## 2014-08-09 ENCOUNTER — Telehealth: Payer: Self-pay | Admitting: Internal Medicine

## 2014-08-09 ENCOUNTER — Other Ambulatory Visit (INDEPENDENT_AMBULATORY_CARE_PROVIDER_SITE_OTHER): Payer: 59

## 2014-08-09 DIAGNOSIS — K754 Autoimmune hepatitis: Secondary | ICD-10-CM

## 2014-08-09 LAB — HEPATIC FUNCTION PANEL
ALBUMIN: 4.4 g/dL (ref 3.5–5.2)
ALK PHOS: 81 U/L (ref 39–117)
ALT: 26 U/L (ref 0–35)
AST: 27 U/L (ref 0–37)
BILIRUBIN TOTAL: 1 mg/dL (ref 0.2–1.2)
Bilirubin, Direct: 0.1 mg/dL (ref 0.0–0.3)
Total Protein: 7.8 g/dL (ref 6.0–8.3)

## 2014-08-09 NOTE — Telephone Encounter (Signed)
Pt states she is due for labs in October but would like to come early. States she has been having problems with reflux and stomach pain. Pt states she will come today for labs.

## 2014-08-10 ENCOUNTER — Other Ambulatory Visit: Payer: Self-pay

## 2014-08-10 DIAGNOSIS — K754 Autoimmune hepatitis: Secondary | ICD-10-CM

## 2015-01-23 ENCOUNTER — Telehealth: Payer: Self-pay

## 2015-01-23 NOTE — Telephone Encounter (Signed)
-----   Message from Lily LovingsLinda Ray Evella Kasal, RN sent at 08/10/2014 11:25 AM EDT ----- Regarding: LFT's  Pt needs LFT's in March, order in

## 2015-01-23 NOTE — Telephone Encounter (Signed)
Pt aware.

## 2015-01-31 ENCOUNTER — Other Ambulatory Visit: Payer: Self-pay

## 2015-01-31 ENCOUNTER — Other Ambulatory Visit (INDEPENDENT_AMBULATORY_CARE_PROVIDER_SITE_OTHER): Payer: 59

## 2015-01-31 DIAGNOSIS — R945 Abnormal results of liver function studies: Principal | ICD-10-CM

## 2015-01-31 DIAGNOSIS — K754 Autoimmune hepatitis: Secondary | ICD-10-CM

## 2015-01-31 DIAGNOSIS — R7989 Other specified abnormal findings of blood chemistry: Secondary | ICD-10-CM

## 2015-01-31 LAB — HEPATIC FUNCTION PANEL
ALBUMIN: 4.4 g/dL (ref 3.5–5.2)
ALT: 15 U/L (ref 0–35)
AST: 16 U/L (ref 0–37)
Alkaline Phosphatase: 85 U/L (ref 39–117)
BILIRUBIN DIRECT: 0.1 mg/dL (ref 0.0–0.3)
BILIRUBIN TOTAL: 0.6 mg/dL (ref 0.2–1.2)
TOTAL PROTEIN: 7.2 g/dL (ref 6.0–8.3)

## 2015-07-25 ENCOUNTER — Other Ambulatory Visit (INDEPENDENT_AMBULATORY_CARE_PROVIDER_SITE_OTHER): Payer: 59

## 2015-07-25 DIAGNOSIS — R945 Abnormal results of liver function studies: Principal | ICD-10-CM

## 2015-07-25 DIAGNOSIS — R7989 Other specified abnormal findings of blood chemistry: Secondary | ICD-10-CM | POA: Diagnosis not present

## 2015-07-25 LAB — HEPATIC FUNCTION PANEL
ALK PHOS: 77 U/L (ref 39–117)
ALT: 13 U/L (ref 0–35)
AST: 18 U/L (ref 0–37)
Albumin: 4.1 g/dL (ref 3.5–5.2)
Bilirubin, Direct: 0.2 mg/dL (ref 0.0–0.3)
TOTAL PROTEIN: 6.9 g/dL (ref 6.0–8.3)
Total Bilirubin: 0.9 mg/dL (ref 0.2–1.2)

## 2015-07-26 ENCOUNTER — Other Ambulatory Visit: Payer: Self-pay

## 2015-07-26 DIAGNOSIS — R7989 Other specified abnormal findings of blood chemistry: Secondary | ICD-10-CM

## 2015-07-26 DIAGNOSIS — R945 Abnormal results of liver function studies: Principal | ICD-10-CM

## 2016-01-23 ENCOUNTER — Telehealth: Payer: Self-pay

## 2016-01-23 NOTE — Telephone Encounter (Signed)
-----   Message from Chrystie NoseLinda R Hunt, RN sent at 07/26/2015  9:49 AM EDT ----- Regarding: LFT's Pt needs lfts done in March, order in epic.

## 2016-01-23 NOTE — Telephone Encounter (Signed)
Pt aware.

## 2016-02-01 ENCOUNTER — Other Ambulatory Visit (INDEPENDENT_AMBULATORY_CARE_PROVIDER_SITE_OTHER): Payer: 59

## 2016-02-01 DIAGNOSIS — R7989 Other specified abnormal findings of blood chemistry: Secondary | ICD-10-CM | POA: Diagnosis not present

## 2016-02-01 DIAGNOSIS — R945 Abnormal results of liver function studies: Principal | ICD-10-CM

## 2016-02-01 LAB — HEPATIC FUNCTION PANEL
ALBUMIN: 4.4 g/dL (ref 3.5–5.2)
ALK PHOS: 78 U/L (ref 39–117)
ALT: 17 U/L (ref 0–35)
AST: 18 U/L (ref 0–37)
BILIRUBIN DIRECT: 0.1 mg/dL (ref 0.0–0.3)
Total Bilirubin: 0.5 mg/dL (ref 0.2–1.2)
Total Protein: 7.1 g/dL (ref 6.0–8.3)

## 2016-02-02 ENCOUNTER — Other Ambulatory Visit: Payer: Self-pay

## 2016-02-02 DIAGNOSIS — K754 Autoimmune hepatitis: Secondary | ICD-10-CM

## 2016-07-30 ENCOUNTER — Other Ambulatory Visit: Payer: Self-pay

## 2016-07-30 ENCOUNTER — Other Ambulatory Visit (INDEPENDENT_AMBULATORY_CARE_PROVIDER_SITE_OTHER): Payer: 59

## 2016-07-30 DIAGNOSIS — K754 Autoimmune hepatitis: Secondary | ICD-10-CM

## 2016-07-30 LAB — HEPATIC FUNCTION PANEL
ALBUMIN: 4.2 g/dL (ref 3.5–5.2)
ALK PHOS: 73 U/L (ref 39–117)
ALT: 16 U/L (ref 0–35)
AST: 16 U/L (ref 0–37)
Bilirubin, Direct: 0.1 mg/dL (ref 0.0–0.3)
TOTAL PROTEIN: 7.1 g/dL (ref 6.0–8.3)
Total Bilirubin: 0.4 mg/dL (ref 0.2–1.2)

## 2017-01-20 ENCOUNTER — Telehealth: Payer: Self-pay

## 2017-01-20 NOTE — Telephone Encounter (Signed)
Letter mailed to pt.  

## 2017-01-20 NOTE — Telephone Encounter (Signed)
-----   Message from Chrystie NoseLinda R Hunt, RN sent at 07/30/2016 11:42 AM EDT ----- Regarding: LFT's Pt needs lfts drawn, order in epic.

## 2017-01-31 ENCOUNTER — Other Ambulatory Visit: Payer: Self-pay

## 2017-01-31 ENCOUNTER — Other Ambulatory Visit (INDEPENDENT_AMBULATORY_CARE_PROVIDER_SITE_OTHER): Payer: 59

## 2017-01-31 DIAGNOSIS — K754 Autoimmune hepatitis: Secondary | ICD-10-CM | POA: Diagnosis not present

## 2017-01-31 LAB — HEPATIC FUNCTION PANEL
ALT: 11 U/L (ref 0–35)
AST: 13 U/L (ref 0–37)
Albumin: 4.1 g/dL (ref 3.5–5.2)
Alkaline Phosphatase: 72 U/L (ref 39–117)
BILIRUBIN DIRECT: 0.2 mg/dL (ref 0.0–0.3)
TOTAL PROTEIN: 6.8 g/dL (ref 6.0–8.3)
Total Bilirubin: 0.9 mg/dL (ref 0.2–1.2)

## 2017-07-11 ENCOUNTER — Telehealth: Payer: Self-pay | Admitting: Internal Medicine

## 2017-07-11 NOTE — Telephone Encounter (Signed)
Patient notified

## 2017-07-11 NOTE — Telephone Encounter (Signed)
Patient with a history of autoimmune hepatitis.  She was diagnosed with shingles today and was prescribed acyclovir today 800 mg five times a day.  Her primary instructed her to get permission to take from GI prior to initiating therapy.  Dr. Adela Lank you are MD of the day please advise

## 2017-07-11 NOTE — Telephone Encounter (Signed)
Left message for patient to call back  

## 2017-07-11 NOTE — Telephone Encounter (Signed)
Taylor Howell that should be fine. I don't see any medications in Epic she takes that would interact, she should proceed with therapy as prescribed

## 2017-07-28 ENCOUNTER — Other Ambulatory Visit (INDEPENDENT_AMBULATORY_CARE_PROVIDER_SITE_OTHER): Payer: 59

## 2017-07-28 ENCOUNTER — Other Ambulatory Visit: Payer: Self-pay

## 2017-07-28 DIAGNOSIS — R945 Abnormal results of liver function studies: Principal | ICD-10-CM

## 2017-07-28 DIAGNOSIS — K754 Autoimmune hepatitis: Secondary | ICD-10-CM

## 2017-07-28 DIAGNOSIS — R7989 Other specified abnormal findings of blood chemistry: Secondary | ICD-10-CM

## 2017-07-28 LAB — HEPATIC FUNCTION PANEL
ALBUMIN: 4.3 g/dL (ref 3.5–5.2)
ALK PHOS: 69 U/L (ref 39–117)
ALT: 18 U/L (ref 0–35)
AST: 17 U/L (ref 0–37)
Bilirubin, Direct: 0.1 mg/dL (ref 0.0–0.3)
Total Bilirubin: 0.7 mg/dL (ref 0.2–1.2)
Total Protein: 7 g/dL (ref 6.0–8.3)

## 2018-02-02 ENCOUNTER — Other Ambulatory Visit (INDEPENDENT_AMBULATORY_CARE_PROVIDER_SITE_OTHER): Payer: BLUE CROSS/BLUE SHIELD

## 2018-02-02 DIAGNOSIS — R945 Abnormal results of liver function studies: Secondary | ICD-10-CM

## 2018-02-02 DIAGNOSIS — R7989 Other specified abnormal findings of blood chemistry: Secondary | ICD-10-CM

## 2018-02-02 LAB — HEPATIC FUNCTION PANEL
ALT: 27 U/L (ref 0–35)
AST: 20 U/L (ref 0–37)
Albumin: 4.3 g/dL (ref 3.5–5.2)
Alkaline Phosphatase: 80 U/L (ref 39–117)
BILIRUBIN TOTAL: 0.7 mg/dL (ref 0.2–1.2)
Bilirubin, Direct: 0.1 mg/dL (ref 0.0–0.3)
TOTAL PROTEIN: 7.3 g/dL (ref 6.0–8.3)

## 2019-02-01 ENCOUNTER — Other Ambulatory Visit: Payer: Self-pay

## 2019-02-01 DIAGNOSIS — R945 Abnormal results of liver function studies: Principal | ICD-10-CM

## 2019-02-01 DIAGNOSIS — R7989 Other specified abnormal findings of blood chemistry: Secondary | ICD-10-CM

## 2019-02-02 ENCOUNTER — Other Ambulatory Visit (INDEPENDENT_AMBULATORY_CARE_PROVIDER_SITE_OTHER): Payer: BLUE CROSS/BLUE SHIELD

## 2019-02-02 DIAGNOSIS — R945 Abnormal results of liver function studies: Secondary | ICD-10-CM

## 2019-02-02 DIAGNOSIS — R7989 Other specified abnormal findings of blood chemistry: Secondary | ICD-10-CM

## 2019-02-02 LAB — HEPATIC FUNCTION PANEL
ALBUMIN: 4.6 g/dL (ref 3.5–5.2)
ALT: 26 U/L (ref 0–35)
AST: 20 U/L (ref 0–37)
Alkaline Phosphatase: 83 U/L (ref 39–117)
Bilirubin, Direct: 0.1 mg/dL (ref 0.0–0.3)
Total Bilirubin: 0.5 mg/dL (ref 0.2–1.2)
Total Protein: 7.4 g/dL (ref 6.0–8.3)

## 2020-01-28 ENCOUNTER — Other Ambulatory Visit (INDEPENDENT_AMBULATORY_CARE_PROVIDER_SITE_OTHER): Payer: BC Managed Care – PPO

## 2020-01-28 ENCOUNTER — Other Ambulatory Visit: Payer: Self-pay | Admitting: *Deleted

## 2020-01-28 ENCOUNTER — Telehealth: Payer: Self-pay | Admitting: Internal Medicine

## 2020-01-28 DIAGNOSIS — R7989 Other specified abnormal findings of blood chemistry: Secondary | ICD-10-CM

## 2020-01-28 LAB — HEPATIC FUNCTION PANEL
ALT: 104 U/L — ABNORMAL HIGH (ref 0–35)
AST: 63 U/L — ABNORMAL HIGH (ref 0–37)
Albumin: 4.2 g/dL (ref 3.5–5.2)
Alkaline Phosphatase: 92 U/L (ref 39–117)
Bilirubin, Direct: 0.1 mg/dL (ref 0.0–0.3)
Total Bilirubin: 0.7 mg/dL (ref 0.2–1.2)
Total Protein: 7.1 g/dL (ref 6.0–8.3)

## 2020-01-28 NOTE — Telephone Encounter (Signed)
See 3/12 result note

## 2020-01-31 ENCOUNTER — Other Ambulatory Visit: Payer: Self-pay

## 2020-01-31 DIAGNOSIS — R7989 Other specified abnormal findings of blood chemistry: Secondary | ICD-10-CM

## 2020-01-31 NOTE — Telephone Encounter (Signed)
See results note 3/12

## 2020-01-31 NOTE — Telephone Encounter (Signed)
Patient is calling back and is requesting to go over levels of test with someone.

## 2020-01-31 NOTE — Telephone Encounter (Signed)
Patient is returning your call per result note patient is aware of mildly elevated liver test and she is to follow up within a month or so however I believe she also needs additional lab work done please advise.

## 2020-02-01 ENCOUNTER — Other Ambulatory Visit (INDEPENDENT_AMBULATORY_CARE_PROVIDER_SITE_OTHER): Payer: BC Managed Care – PPO

## 2020-02-01 DIAGNOSIS — R7989 Other specified abnormal findings of blood chemistry: Secondary | ICD-10-CM

## 2020-02-01 LAB — COMPREHENSIVE METABOLIC PANEL
ALT: 168 U/L — ABNORMAL HIGH (ref 0–35)
AST: 96 U/L — ABNORMAL HIGH (ref 0–37)
Albumin: 4.4 g/dL (ref 3.5–5.2)
Alkaline Phosphatase: 101 U/L (ref 39–117)
BUN: 18 mg/dL (ref 6–23)
CO2: 26 mEq/L (ref 19–32)
Calcium: 9.9 mg/dL (ref 8.4–10.5)
Chloride: 101 mEq/L (ref 96–112)
Creatinine, Ser: 0.72 mg/dL (ref 0.40–1.20)
GFR: 84.21 mL/min (ref >60.00)
Glucose, Bld: 101 mg/dL — ABNORMAL HIGH (ref 70–99)
Potassium: 3.9 mEq/L (ref 3.5–5.1)
Sodium: 136 mEq/L (ref 135–145)
Total Bilirubin: 0.7 mg/dL (ref 0.2–1.2)
Total Protein: 7.6 g/dL (ref 6.0–8.3)

## 2020-02-01 LAB — CBC WITH DIFFERENTIAL/PLATELET
Basophils Absolute: 0.1 10*3/uL (ref 0.0–0.1)
Basophils Relative: 0.7 % (ref 0.0–3.0)
Eosinophils Absolute: 0.1 10*3/uL (ref 0.0–0.7)
Eosinophils Relative: 1.2 % (ref 0.0–5.0)
HCT: 41.3 % (ref 36.0–46.0)
Hemoglobin: 14.2 g/dL (ref 12.0–15.0)
Lymphocytes Relative: 24.1 % (ref 12.0–46.0)
Lymphs Abs: 2.2 10*3/uL (ref 0.7–4.0)
MCHC: 34.4 g/dL (ref 30.0–36.0)
MCV: 92.4 fl (ref 78.0–100.0)
Monocytes Absolute: 0.8 10*3/uL (ref 0.1–1.0)
Monocytes Relative: 8.5 % (ref 3.0–12.0)
Neutro Abs: 6.1 10*3/uL (ref 1.4–7.7)
Neutrophils Relative %: 65.5 % (ref 43.0–77.0)
Platelets: 269 10*3/uL (ref 150.0–400.0)
RBC: 4.47 Mil/uL (ref 3.87–5.11)
RDW: 12.8 % (ref 11.5–15.5)
WBC: 9.3 10*3/uL (ref 4.0–10.5)

## 2020-02-01 LAB — PROTIME-INR
INR: 1 ratio (ref 0.8–1.0)
Prothrombin Time: 11.5 s (ref 9.6–13.1)

## 2020-02-01 NOTE — Telephone Encounter (Signed)
Dr. Perry pt 

## 2020-02-01 NOTE — Telephone Encounter (Signed)
Let patient know that her liver tests are mildly elevated. Please check CBC,CMET, PT/INR in 2 weeks and arrange office follow up anytime after that in the next month or so. Hasn't been seen in 8 years.Thanks

## 2020-02-01 NOTE — Telephone Encounter (Signed)
The pt has been advised of her AST and ALT per pt request.  She will keep appt as planned for next week.

## 2020-02-03 ENCOUNTER — Telehealth: Payer: Self-pay | Admitting: Internal Medicine

## 2020-02-03 NOTE — Telephone Encounter (Signed)
Spoke with pt and she is aware of results and knows to keep her OV as scheduled.

## 2020-02-09 ENCOUNTER — Ambulatory Visit: Payer: BC Managed Care – PPO | Admitting: Physician Assistant

## 2020-02-09 ENCOUNTER — Ambulatory Visit: Payer: BC Managed Care – PPO | Admitting: Nurse Practitioner

## 2020-02-09 ENCOUNTER — Encounter: Payer: Self-pay | Admitting: Nurse Practitioner

## 2020-02-09 ENCOUNTER — Other Ambulatory Visit (INDEPENDENT_AMBULATORY_CARE_PROVIDER_SITE_OTHER): Payer: BC Managed Care – PPO

## 2020-02-09 VITALS — BP 128/82 | HR 68 | Temp 97.9°F | Ht 66.0 in | Wt 240.0 lb

## 2020-02-09 DIAGNOSIS — K754 Autoimmune hepatitis: Secondary | ICD-10-CM | POA: Diagnosis not present

## 2020-02-09 DIAGNOSIS — K219 Gastro-esophageal reflux disease without esophagitis: Secondary | ICD-10-CM

## 2020-02-09 LAB — HEPATIC FUNCTION PANEL
ALT: 126 U/L — ABNORMAL HIGH (ref 0–35)
AST: 59 U/L — ABNORMAL HIGH (ref 0–37)
Albumin: 4.6 g/dL (ref 3.5–5.2)
Alkaline Phosphatase: 98 U/L (ref 39–117)
Bilirubin, Direct: 0.1 mg/dL (ref 0.0–0.3)
Total Bilirubin: 0.5 mg/dL (ref 0.2–1.2)
Total Protein: 7.6 g/dL (ref 6.0–8.3)

## 2020-02-09 NOTE — Patient Instructions (Signed)
If you are age 55 or older, your body mass index should be between 23-30. Your Body mass index is 38.74 kg/m. If this is out of the aforementioned range listed, please consider follow up with your Primary Care Provider.  If you are age 48 or younger, your body mass index should be between 19-25. Your Body mass index is 38.74 kg/m. If this is out of the aformentioned range listed, please consider follow up with your Primary Care Provider.   Your provider has requested that you go to the basement level for lab work before leaving today. Press "B" on the elevator. The lab is located at the first door on the left as you exit the elevator.  Labs on 02/23/20 - return to basement to have labs repeated.  We will call you with results.  Thank you for choosing me and [facility].   Willette Cluster, NP

## 2020-02-09 NOTE — Progress Notes (Signed)
Excellent assessment reviewed. The differential is recurrent mild autoimmune hepatitis flare versus fatty liver. She needs to exercise and loose weight, regardless. Repeat liver tests in 4-6 weeks. If still elevated, I would consider a course of prednisone. Keep me in the loop. Thanks

## 2020-02-09 NOTE — Progress Notes (Signed)
IMPRESSION and PLAN:    54 yo female with PMH significant for GERD, H. pylori infection , and autoimmune hepatitis diagnosed 2012 ( serology negative)  # Abnormal liver enzymes --Normal liver tests for several years , enzymes now trending up.  AST 96 / ALT of 168 on 02/01/20.  INR normal. CBC normal.  --Repeat liver tests today to see if still trending up --She is interested in getting the Covid vaccine unless Dr. Henrene Pastor objects  # Colon cancer screening.   --She reports negative Cologuard in spring 2020 --No blood in stool, she is not anemic --No family history colon cancer  # GERD --Regurgitation and heartburn over the last month.  Takes Prilosec as needed      HPI:    Primary GI: Dr. Henrene Pastor  Chief complaint : follow up on abnormal liver tests  Patient is a 54 year old female followed for years by Dr. Henrene Pastor for autoimmune hepatitis.  She has not been seen here in approximately 8 years, currently not on any treatment for AIH.  Her liver tests have been normal through the years until routine labs on 01/28/2020 showed AST of 63 and ALT of 104.  Based on those labs we advised patient to come back and for CBC, CMP and INR in 2 weeks.  For unclear reason she was brought back just 4 days later to have those labs done on 02/01/2020.  Her AST had risen further to 96 and ALT of 168.  Remaining liver test normal.  She does not take any medications other than very occasional Tylenol or Aleve.  No herbs.  No vitamins.  No Etoh. Last week patient began having mild generalized joint aches.  No fever.  She describes chronic intermittent RUQ discomfort which has been a little more "active" recently.  Overeating contributes to the pain.  Her bowel movements are normal, no blood in stool.    Patient has a history of GERD with regurgitation and heartburn.  She developed recurrent symptoms about a month ago.  Takes Prilosec as needed.  Certain foods trigger her symptoms .   She reports a negative  Cologuard in spring 2020.   Review of systems: All systems reviewed and negative except where noted in the HPI  Past Medical History:  Diagnosis Date  . Autoimmune hepatitis ([place])   . Helicobacter pylori (H. pylori)   . Hyperbilirubinemia    Past Surgical History:  Procedure Laterality Date  . CYSTECTOMY     Head   Family History  Problem Relation Age of Onset  . Diabetes Maternal Grandfather   . Breast cancer Maternal Aunt   . Colon cancer Neg Hx   . Stomach cancer Neg Hx   . Rectal cancer Neg Hx   . Pancreatic cancer Neg Hx   . Esophageal cancer Neg Hx     Patient's surgical history, family medical history, social history, medications and allergies were all reviewed in Epic   Serum creatinine: 0.72 mg/dL 02/01/20 1408 Estimated creatinine clearance: 100.4 mL/min  No current outpatient medications on file.   No current facility-administered medications for this visit.    Physical Exam:     BP 128/82   Pulse 68   Temp 97.9 F (36.6 C)   Ht 5\' 6"  (1.676 m)   Wt 240 lb (108.9 kg)   BMI 38.74 kg/m   GENERAL:  Pleasant female in NAD PSYCH: : Cooperative, normal affect CARDIAC:  RRR, no murmur heard, no  peripheral edema PULM: Normal respiratory effort, lungs CTA bilaterally, no wheezing ABDOMEN:  Nondistended, soft, nontender. No obvious masses, no hepatomegaly,  normal bowel sounds SKIN:  turgor, no lesions seen Musculoskeletal:  Normal muscle tone, normal strength NEURO: Alert and oriented x 3, no focal neurologic deficits   Willette Cluster , NP 02/09/2020, 2:12 PM

## 2020-02-10 ENCOUNTER — Other Ambulatory Visit: Payer: Self-pay

## 2020-02-10 DIAGNOSIS — R7989 Other specified abnormal findings of blood chemistry: Secondary | ICD-10-CM

## 2020-03-09 ENCOUNTER — Other Ambulatory Visit: Payer: Self-pay

## 2020-03-16 ENCOUNTER — Other Ambulatory Visit (INDEPENDENT_AMBULATORY_CARE_PROVIDER_SITE_OTHER): Payer: BC Managed Care – PPO

## 2020-03-16 DIAGNOSIS — K754 Autoimmune hepatitis: Secondary | ICD-10-CM

## 2020-03-16 LAB — HEPATIC FUNCTION PANEL
ALT: 30 U/L (ref 0–35)
AST: 23 U/L (ref 0–37)
Albumin: 4.3 g/dL (ref 3.5–5.2)
Alkaline Phosphatase: 84 U/L (ref 39–117)
Bilirubin, Direct: 0.1 mg/dL (ref 0.0–0.3)
Total Bilirubin: 0.6 mg/dL (ref 0.2–1.2)
Total Protein: 6.8 g/dL (ref 6.0–8.3)

## 2020-03-30 ENCOUNTER — Telehealth: Payer: Self-pay

## 2020-03-30 ENCOUNTER — Other Ambulatory Visit: Payer: Self-pay

## 2020-03-30 ENCOUNTER — Telehealth: Payer: Self-pay | Admitting: Nurse Practitioner

## 2020-03-30 DIAGNOSIS — R7989 Other specified abnormal findings of blood chemistry: Secondary | ICD-10-CM

## 2020-03-30 NOTE — Telephone Encounter (Signed)
Sorry for delay. They are back to normal. Dr. Marina Goodell is most familiar with her so will see when he wants follow up labs [facility], I know you have sent him a phone note about that). Thanks

## 2020-03-30 NOTE — Telephone Encounter (Signed)
Patient is aware of her normal hepatic panel. She would like to know what follow up she needs going forward.  Thank you

## 2020-03-30 NOTE — Telephone Encounter (Signed)
Taylor Howell LFT. Please review and left me know next step. Thanks

## 2020-03-30 NOTE — Telephone Encounter (Signed)
Spoke with the patient. She understands her question has been forwarded to Dr Marina Goodell.

## 2020-03-30 NOTE — Telephone Encounter (Signed)
Spoke with the patient. She will plan to return for labs the end of July.

## 2020-03-30 NOTE — Telephone Encounter (Signed)
Pt would like to know if a decision was made about her recent lab results. Pt is a bit upset that she has not heard back from her doctor yet. Pls call pt.

## 2020-03-30 NOTE — Telephone Encounter (Signed)
Repeat liver test in 3 months.  Please let patient know

## 2020-06-28 ENCOUNTER — Other Ambulatory Visit (INDEPENDENT_AMBULATORY_CARE_PROVIDER_SITE_OTHER): Payer: BC Managed Care – PPO

## 2020-06-28 DIAGNOSIS — R7989 Other specified abnormal findings of blood chemistry: Secondary | ICD-10-CM

## 2020-06-28 LAB — HEPATIC FUNCTION PANEL
ALT: 28 U/L (ref 0–35)
AST: 22 U/L (ref 0–37)
Albumin: 4.4 g/dL (ref 3.5–5.2)
Alkaline Phosphatase: 81 U/L (ref 39–117)
Bilirubin, Direct: 0.1 mg/dL (ref 0.0–0.3)
Total Bilirubin: 0.6 mg/dL (ref 0.2–1.2)
Total Protein: 7.2 g/dL (ref 6.0–8.3)

## 2020-12-29 ENCOUNTER — Other Ambulatory Visit: Payer: Self-pay

## 2020-12-29 DIAGNOSIS — R7989 Other specified abnormal findings of blood chemistry: Secondary | ICD-10-CM

## 2021-01-09 ENCOUNTER — Other Ambulatory Visit (INDEPENDENT_AMBULATORY_CARE_PROVIDER_SITE_OTHER): Payer: BC Managed Care – PPO

## 2021-01-09 ENCOUNTER — Other Ambulatory Visit: Payer: Self-pay

## 2021-01-09 DIAGNOSIS — R7989 Other specified abnormal findings of blood chemistry: Secondary | ICD-10-CM | POA: Diagnosis not present

## 2021-01-09 LAB — HEPATIC FUNCTION PANEL
ALT: 28 U/L (ref 0–35)
AST: 22 U/L (ref 0–37)
Albumin: 4.3 g/dL (ref 3.5–5.2)
Alkaline Phosphatase: 81 U/L (ref 39–117)
Bilirubin, Direct: 0.1 mg/dL (ref 0.0–0.3)
Total Bilirubin: 0.8 mg/dL (ref 0.2–1.2)
Total Protein: 7.1 g/dL (ref 6.0–8.3)

## 2021-07-09 ENCOUNTER — Telehealth: Payer: Self-pay

## 2021-07-09 NOTE — Telephone Encounter (Signed)
-----   Message from Chrystie Nose, RN sent at 01/09/2021  3:00 PM EST ----- Regarding: LFT's Pt needs labs, order in epic.

## 2021-07-09 NOTE — Telephone Encounter (Signed)
Pt was notified that she needs to come in for labs.Orders are in EPIC

## 2021-07-10 ENCOUNTER — Other Ambulatory Visit (INDEPENDENT_AMBULATORY_CARE_PROVIDER_SITE_OTHER): Payer: BC Managed Care – PPO

## 2021-07-10 DIAGNOSIS — R7989 Other specified abnormal findings of blood chemistry: Secondary | ICD-10-CM

## 2021-07-10 LAB — HEPATIC FUNCTION PANEL
ALT: 18 U/L (ref 0–35)
AST: 17 U/L (ref 0–37)
Albumin: 4.1 g/dL (ref 3.5–5.2)
Alkaline Phosphatase: 84 U/L (ref 39–117)
Bilirubin, Direct: 0.1 mg/dL (ref 0.0–0.3)
Total Bilirubin: 0.6 mg/dL (ref 0.2–1.2)
Total Protein: 6.7 g/dL (ref 6.0–8.3)

## 2021-07-11 ENCOUNTER — Other Ambulatory Visit: Payer: Self-pay

## 2021-07-11 DIAGNOSIS — R7989 Other specified abnormal findings of blood chemistry: Secondary | ICD-10-CM

## 2021-07-11 DIAGNOSIS — K754 Autoimmune hepatitis: Secondary | ICD-10-CM

## 2022-01-22 ENCOUNTER — Other Ambulatory Visit (INDEPENDENT_AMBULATORY_CARE_PROVIDER_SITE_OTHER): Payer: BC Managed Care – PPO

## 2022-01-22 DIAGNOSIS — K754 Autoimmune hepatitis: Secondary | ICD-10-CM | POA: Diagnosis not present

## 2022-01-22 DIAGNOSIS — R7989 Other specified abnormal findings of blood chemistry: Secondary | ICD-10-CM

## 2022-01-22 LAB — HEPATIC FUNCTION PANEL
ALT: 18 U/L (ref 0–35)
AST: 18 U/L (ref 0–37)
Albumin: 4.5 g/dL (ref 3.5–5.2)
Alkaline Phosphatase: 89 U/L (ref 39–117)
Bilirubin, Direct: 0.1 mg/dL (ref 0.0–0.3)
Total Bilirubin: 0.6 mg/dL (ref 0.2–1.2)
Total Protein: 7.1 g/dL (ref 6.0–8.3)

## 2022-08-22 LAB — EXTERNAL GENERIC LAB PROCEDURE: COLOGUARD: NEGATIVE

## 2023-01-27 ENCOUNTER — Telehealth: Payer: Self-pay | Admitting: Nurse Practitioner

## 2023-01-27 NOTE — Telephone Encounter (Signed)
Inbound call from pt , she said she have to get Lab work every 6 months or so and she wants to do when does she need to go back to lab ?Marland KitchenPlease advise

## 2023-01-28 ENCOUNTER — Other Ambulatory Visit: Payer: Self-pay

## 2023-01-28 DIAGNOSIS — R7989 Other specified abnormal findings of blood chemistry: Secondary | ICD-10-CM

## 2023-01-28 DIAGNOSIS — K754 Autoimmune hepatitis: Secondary | ICD-10-CM

## 2023-01-28 NOTE — Telephone Encounter (Signed)
Just liver tests at this time would be fine.  Thanks

## 2023-01-28 NOTE — Telephone Encounter (Signed)
Dr Henrene Pastor  Hx of elevated LFT and autoimmune hepatitis Last Hepatic panel March 2023 Last office visit March 2021 Do you want a repeat of labs only? Thanks

## 2023-01-28 NOTE — Telephone Encounter (Signed)
Called the patient to advise her to come for her labs. Dr Henrene Pastor has given the lab the orders. No answer. Left a message with this information.

## 2023-02-05 ENCOUNTER — Other Ambulatory Visit (INDEPENDENT_AMBULATORY_CARE_PROVIDER_SITE_OTHER): Payer: BC Managed Care – PPO

## 2023-02-05 DIAGNOSIS — R7989 Other specified abnormal findings of blood chemistry: Secondary | ICD-10-CM

## 2023-02-05 DIAGNOSIS — K754 Autoimmune hepatitis: Secondary | ICD-10-CM | POA: Diagnosis not present

## 2023-02-05 LAB — HEPATIC FUNCTION PANEL
ALT: 25 U/L (ref 0–35)
AST: 22 U/L (ref 0–37)
Albumin: 4.3 g/dL (ref 3.5–5.2)
Alkaline Phosphatase: 83 U/L (ref 39–117)
Bilirubin, Direct: 0.1 mg/dL (ref 0.0–0.3)
Total Bilirubin: 0.7 mg/dL (ref 0.2–1.2)
Total Protein: 7.1 g/dL (ref 6.0–8.3)

## 2024-02-12 ENCOUNTER — Other Ambulatory Visit: Payer: Self-pay

## 2024-02-12 ENCOUNTER — Telehealth: Payer: Self-pay

## 2024-02-12 DIAGNOSIS — R7989 Other specified abnormal findings of blood chemistry: Secondary | ICD-10-CM

## 2024-02-12 NOTE — Telephone Encounter (Signed)
 Order in epic, pt states she is planning on coming tomorrow for the lab.

## 2024-02-12 NOTE — Telephone Encounter (Signed)
-----   Message from Nurse Kerrie Buffalo sent at 02/11/2023 12:00 PM EDT ----- Regarding: LFT's Pt needs labs in 1 year

## 2024-02-13 ENCOUNTER — Other Ambulatory Visit (INDEPENDENT_AMBULATORY_CARE_PROVIDER_SITE_OTHER)

## 2024-02-13 DIAGNOSIS — R7989 Other specified abnormal findings of blood chemistry: Secondary | ICD-10-CM

## 2024-02-13 LAB — HEPATIC FUNCTION PANEL
ALT: 22 U/L (ref 0–35)
AST: 18 U/L (ref 0–37)
Albumin: 4.4 g/dL (ref 3.5–5.2)
Alkaline Phosphatase: 81 U/L (ref 39–117)
Bilirubin, Direct: 0.1 mg/dL (ref 0.0–0.3)
Total Bilirubin: 0.6 mg/dL (ref 0.2–1.2)
Total Protein: 7 g/dL (ref 6.0–8.3)

## 2024-02-14 ENCOUNTER — Encounter: Payer: Self-pay | Admitting: Internal Medicine
